# Patient Record
Sex: Female | Born: 1980 | Race: White | Hispanic: No | Marital: Married | Smoking: Never smoker
Health system: Southern US, Community
[De-identification: ages and names within clinical notes are randomized; demographics above are authoritative.]

## PROBLEM LIST (undated history)

## (undated) DIAGNOSIS — K5090 Crohn's disease, unspecified, without complications: Secondary | ICD-10-CM

---

## 2001-02-13 ENCOUNTER — Other Ambulatory Visit: Admission: RE | Admit: 2001-02-13 | Discharge: 2001-02-13 | Payer: Self-pay | Admitting: Otolaryngology

## 2001-04-29 ENCOUNTER — Other Ambulatory Visit: Admission: RE | Admit: 2001-04-29 | Discharge: 2001-04-29 | Payer: Self-pay | Admitting: Obstetrics and Gynecology

## 2001-06-25 ENCOUNTER — Other Ambulatory Visit: Admission: RE | Admit: 2001-06-25 | Discharge: 2001-06-25 | Payer: Self-pay | Admitting: Obstetrics and Gynecology

## 2001-10-21 ENCOUNTER — Other Ambulatory Visit: Admission: RE | Admit: 2001-10-21 | Discharge: 2001-10-21 | Payer: Self-pay | Admitting: Obstetrics and Gynecology

## 2002-02-10 ENCOUNTER — Encounter: Payer: Self-pay | Admitting: Obstetrics and Gynecology

## 2002-02-10 ENCOUNTER — Encounter: Admission: RE | Admit: 2002-02-10 | Discharge: 2002-02-10 | Payer: Self-pay | Admitting: Obstetrics and Gynecology

## 2002-05-04 ENCOUNTER — Other Ambulatory Visit: Admission: RE | Admit: 2002-05-04 | Discharge: 2002-05-04 | Payer: Self-pay | Admitting: Obstetrics and Gynecology

## 2002-08-24 ENCOUNTER — Other Ambulatory Visit: Admission: RE | Admit: 2002-08-24 | Discharge: 2002-08-24 | Payer: Self-pay | Admitting: Obstetrics and Gynecology

## 2002-11-05 ENCOUNTER — Encounter: Admission: RE | Admit: 2002-11-05 | Discharge: 2002-11-05 | Payer: Self-pay | Admitting: Internal Medicine

## 2002-11-05 ENCOUNTER — Encounter: Payer: Self-pay | Admitting: Internal Medicine

## 2002-12-17 ENCOUNTER — Encounter: Admission: RE | Admit: 2002-12-17 | Discharge: 2002-12-17 | Payer: Self-pay | Admitting: Internal Medicine

## 2002-12-17 ENCOUNTER — Encounter: Payer: Self-pay | Admitting: Internal Medicine

## 2002-12-30 ENCOUNTER — Other Ambulatory Visit: Admission: RE | Admit: 2002-12-30 | Discharge: 2002-12-30 | Payer: Self-pay | Admitting: Obstetrics and Gynecology

## 2003-05-06 ENCOUNTER — Other Ambulatory Visit: Admission: RE | Admit: 2003-05-06 | Discharge: 2003-05-06 | Payer: Self-pay | Admitting: Obstetrics and Gynecology

## 2004-04-10 ENCOUNTER — Encounter (INDEPENDENT_AMBULATORY_CARE_PROVIDER_SITE_OTHER): Payer: Self-pay | Admitting: Specialist

## 2004-04-10 ENCOUNTER — Ambulatory Visit (HOSPITAL_COMMUNITY): Admission: RE | Admit: 2004-04-10 | Discharge: 2004-04-10 | Payer: Self-pay | Admitting: Internal Medicine

## 2004-04-21 ENCOUNTER — Encounter: Admission: RE | Admit: 2004-04-21 | Discharge: 2004-04-21 | Payer: Self-pay | Admitting: Internal Medicine

## 2004-05-16 ENCOUNTER — Encounter (INDEPENDENT_AMBULATORY_CARE_PROVIDER_SITE_OTHER): Payer: Self-pay | Admitting: Specialist

## 2004-05-16 ENCOUNTER — Inpatient Hospital Stay (HOSPITAL_COMMUNITY): Admission: RE | Admit: 2004-05-16 | Discharge: 2004-05-23 | Payer: Self-pay | Admitting: General Surgery

## 2004-11-24 ENCOUNTER — Ambulatory Visit: Payer: Self-pay | Admitting: Internal Medicine

## 2004-11-27 ENCOUNTER — Ambulatory Visit: Payer: Self-pay | Admitting: Internal Medicine

## 2004-12-13 ENCOUNTER — Ambulatory Visit: Payer: Self-pay | Admitting: Internal Medicine

## 2005-02-08 ENCOUNTER — Ambulatory Visit: Payer: Self-pay | Admitting: Internal Medicine

## 2005-02-26 ENCOUNTER — Ambulatory Visit: Payer: Self-pay | Admitting: Internal Medicine

## 2005-02-26 ENCOUNTER — Ambulatory Visit (HOSPITAL_COMMUNITY): Admission: RE | Admit: 2005-02-26 | Discharge: 2005-02-26 | Payer: Self-pay | Admitting: Internal Medicine

## 2005-07-16 ENCOUNTER — Emergency Department (HOSPITAL_COMMUNITY): Admission: EM | Admit: 2005-07-16 | Discharge: 2005-07-16 | Payer: Self-pay | Admitting: Emergency Medicine

## 2007-08-19 ENCOUNTER — Emergency Department (HOSPITAL_COMMUNITY): Admission: EM | Admit: 2007-08-19 | Discharge: 2007-08-19 | Payer: Self-pay | Admitting: Emergency Medicine

## 2008-09-11 ENCOUNTER — Emergency Department (HOSPITAL_COMMUNITY): Admission: EM | Admit: 2008-09-11 | Discharge: 2008-09-11 | Payer: Self-pay | Admitting: Emergency Medicine

## 2011-04-06 NOTE — Discharge Summary (Signed)
NAME:  Betty Mendoza, Betty Mendoza                         ACCOUNT NO.:  [id number]   MEDICAL RECORD NO.:  [id number]                   PATIENT TYPE:  INP   LOCATION:  0371                                 FACILITY:  [facility]   PHYSICIAN:  Timothy E. Earlene Plater, M.D.              DATE OF BIRTH:  1981/06/22   DATE OF ADMISSION:  05/16/2004  DATE OF DISCHARGE:  05/23/2004                                 DISCHARGE SUMMARY   FINAL DIAGNOSIS:  Colonic inertia.   OPERATIVE PROCEDURE:  On May 16, 2004 total abdominal colectomy.   Please see the enclosed notes.  The patient had been seen, evaluated, and is  ready now to proceed with a total abdominal colectomy and she has been  carefully informed and fully consulted.  She was prepared at home over a 5-  day period in order to clear the colon and get her ready for this surgery.  Upon admission her laboratory data were normal including CBC, chemistry  profile, protime, pregnancy test.  Urinalysis was normal.   The patient was seen, evaluated, taken directly to the operating room where  the above-named procedure was accomplished.  She actually had a smooth,  benign, and uncomplicated postoperative recovery, a little slow in the mid  portion with some ileus, nausea, and vomiting that quickly resolved, and she  is ready for discharge on July 5.  She is on a regular soft small-feeding  diet and doing well.  Bowels are moving satisfactorily.  She is having some  crampy gas pains but no distention or nausea.   She and her mother have been carefully advised about activity, food intake,  expectations, and complications for which she should call.  Vicodin #48 is  given for pain and she will be seen and followed in the office.  Final  pathology report is not available.  The follow-up laboratory and x-rays were  all within normal limits.   The patient will be seen and followed as an outpatient.                                               Timothy E. Earlene Plater, M.D.    TED/MEDQ  D:  05/23/2004  T:  05/23/2004  Job:  [id number]   cc:   Lina Sar, M.D. [facility]   Tasia Catchings, M.D.  [address]  [place]  [place] [address]  Fax: [phone]

## 2011-04-06 NOTE — Op Note (Signed)
NAME:  Betty Mendoza, Betty Mendoza                         ACCOUNT NO.:  [id number]   MEDICAL RECORD NO.:  [id number]                   PATIENT TYPE:  INP   LOCATION:  0005                                 FACILITY:  [facility]   PHYSICIAN:  Timothy E. Earlene Plater, M.D.              DATE OF BIRTH:  07-Apr-1981   DATE OF PROCEDURE:  05/16/2004  DATE OF DISCHARGE:                                 OPERATIVE REPORT   PREOPERATIVE. DIAGNOSES:  Colonic inertia.   POSTOPERATIVE DIAGNOSES:  Colonic inertia.   PROCEDURE:  Total abdominal colectomy.   SURGEON:  Timothy E. Earlene Plater, M.D.   ASSISTANT:  Lebron Conners, M.D.   ANESTHESIA:  CRNA supervised M.D.   Please see the enclosed notes. This young woman has colonic inertia.  After  careful consideration and months of deliberation, consultation by Tasia Catchings, M.D. and Lina Sar, M.D., a complete workup, she wishes to  proceed with total abdominal colectomy. This has been carefully discussed  with her in the office including the expectations and the very real  potential for complications both short and long-term. She agrees and  understands. She was prepped at home over a five day period, brought in this  morning, IV started, hydration begun. She was seen and evaluated by  anesthesia. She was identified and a permit signed.   The patient was taken to the operating room, placed supine, general  endotracheal anesthesia administered.  PAS hose, Foley catheter, nasogastric  tube were placed. The abdomen was prepped and draped in the usual fashion.  The midline was marked and then a mid abdominal midline incision was made,  the peritoneum was entered without complication.  The colon was as expected  elongated and redundant. The cecum and the sigmoid were especially  redundant.  We estimated the points of transection being the junction of the  sigmoid and rectum and at the junction of the terminal ileum and cecum. The  lateral peritoneal bands were divided, the  retroperitoneal structures were  carefully identified including the ovarian veins, the ureters and the great  vessels and these were of course kept posterior. Most of the dissection was  done close to the edge of the colon rather than at the edge of the posterior  peritoneum. The splenic flexure was released without difficulty or  complications. The right colon was likewise dissected from the lateral bands  and the transverse colon was then carefully dissected dividing the omentum  between the colon and the stomach leaving as much as possible. We then  sequentially divided the mesentery of the entire colon between clamps which  were carefully tied with 2-0 silk ties. This freed the entire colon.  The  site at the terminal ileum was chosen, appropriate clamps were applied, the  ileum was divided and then the junction of the rectum and sigmoid was  carefully dissected, this mesentery divided and the colon was divided. The  entire specimen  was passed off the field. All areas were checked for  hemostasis and all were good. The upper abdomen was packed. The entire small  bowel was run, was properly aligned and a end to end anastomosis was created  using 3-0 silk ties, hand sewn end to end.  This was complete. It was  palpated, small bowel contents were run across this and there was no  evidence of air or water leak. Then copious irrigation was carried out.  Instruments and gloves changed. The mesentery was closed where possible with  3-0 Vicryl suture and the small bowel lay nicely across the abdomen.  Irrigation was clear, counts were correct, the anastomosis was secured. Also  to note that during the exploration and surgery, no other abnormalities of  the abdominal cavity were noted.   The patient was stable and closure was accomplished with #1 PDS suture.  Also the subcu was copiously irrigated, skin was closed with wide skin  staples. Final count was correct. The patient was stable,  estimated blood  loss 250 mL, none replaced.  She tolerated it well and was awakened and  taken to the recovery room in good condition.                                               Timothy E. Earlene Plater, M.D.    TED/MEDQ  D:  05/16/2004  T:  05/16/2004  Job:  [id number]   cc:   Lina Sar, M.D. [facility]   Tasia Catchings, M.D.  [address]  [place]  [place] [address]  Fax: [phone]

## 2011-08-20 LAB — CBC
HCT: 29.1 — ABNORMAL LOW
Hemoglobin: 9 — ABNORMAL LOW
MCV: 69.4 — ABNORMAL LOW
Platelets: 275
RBC: 4.2
WBC: 6.6

## 2011-08-20 LAB — POCT I-STAT, CHEM 8
BUN: 7
Calcium, Ion: 1.23
Chloride: 106
Creatinine, Ser: 0.7
HCT: 31 — ABNORMAL LOW
Hemoglobin: 10.5 — ABNORMAL LOW
Potassium: 4.1
TCO2: 23

## 2011-08-20 LAB — DIFFERENTIAL
Basophils Absolute: 0
Eosinophils Relative: 1
Monocytes Relative: 10
Neutrophils Relative %: 46

## 2011-08-20 LAB — POCT CARDIAC MARKERS: CKMB, poc: <1 — ABNORMAL LOW

## 2011-08-30 LAB — URINALYSIS, ROUTINE W REFLEX MICROSCOPIC
Hgb urine dipstick: NEGATIVE
Ketones, ur: 15 — AB
Protein, ur: 30 — AB
Urobilinogen, UA: 0.2

## 2011-08-30 LAB — POCT PREGNANCY, URINE
Operator id: 27011
Preg Test, Ur: NEGATIVE

## 2011-08-30 LAB — I-STAT 8, (EC8 V) (CONVERTED LAB)
Acid-base deficit: 7 — ABNORMAL HIGH
BUN: 15
Chloride: 108
Glucose, Bld: 112 — ABNORMAL HIGH
Hemoglobin: 18 — ABNORMAL HIGH
Operator id: 270111
TCO2: 18
pCO2, Ven: 30 — ABNORMAL LOW
pH, Ven: 7.359 — ABNORMAL HIGH

## 2011-08-30 LAB — CBC
Platelets: 315
RBC: 6.26 — ABNORMAL HIGH
RDW: 18.4 — ABNORMAL HIGH

## 2011-08-30 LAB — DIFFERENTIAL
Basophils Relative: 0
Eosinophils Relative: 1
Lymphocytes Relative: 25
Monocytes Relative: 11
Neutro Abs: 5

## 2011-08-30 LAB — URINE MICROSCOPIC-ADD ON

## 2013-07-26 ENCOUNTER — Emergency Department: Payer: Self-pay | Admitting: Internal Medicine

## 2013-09-04 ENCOUNTER — Emergency Department: Payer: Self-pay | Admitting: Emergency Medicine

## 2013-09-04 LAB — CBC
HCT: 45.1 % (ref 35.0–47.0)
HGB: 14.8 g/dL (ref 12.0–16.0)
MCV: 80 fL (ref 80–100)
RBC: 5.62 10*6/uL — ABNORMAL HIGH (ref 3.80–5.20)
RDW: 15.4 % — ABNORMAL HIGH (ref 11.5–14.5)

## 2013-09-04 LAB — COMPREHENSIVE METABOLIC PANEL
Alkaline Phosphatase: 84 U/L (ref 50–136)
Anion Gap: 8 (ref 7–16)
BUN: 8 mg/dL (ref 7–18)
Bilirubin,Total: 1.7 mg/dL — ABNORMAL HIGH (ref 0.2–1.0)
Chloride: 104 mmol/L (ref 98–107)
Glucose: 111 mg/dL — ABNORMAL HIGH (ref 65–99)
Potassium: 3.7 mmol/L (ref 3.5–5.1)
SGPT (ALT): 16 U/L (ref 12–78)
Sodium: 137 mmol/L (ref 136–145)

## 2013-09-04 LAB — LIPASE, BLOOD: Lipase: 131 U/L (ref 73–393)

## 2013-09-05 LAB — URINALYSIS, COMPLETE
Glucose,UR: NEGATIVE mg/dL (ref 0–75)
Ketone: NEGATIVE
Leukocyte Esterase: NEGATIVE
Nitrite: NEGATIVE
Specific Gravity: 1.025 (ref 1.003–1.030)
Squamous Epithelial: 2

## 2013-09-05 LAB — WET PREP, GENITAL

## 2013-09-05 LAB — GC/CHLAMYDIA PROBE AMP

## 2016-10-31 DIAGNOSIS — R002 Palpitations: Secondary | ICD-10-CM | POA: Insufficient documentation

## 2016-11-07 ENCOUNTER — Ambulatory Visit (INDEPENDENT_AMBULATORY_CARE_PROVIDER_SITE_OTHER): Payer: 59

## 2016-11-07 DIAGNOSIS — R002 Palpitations: Secondary | ICD-10-CM

## 2017-04-30 ENCOUNTER — Emergency Department
Admission: EM | Admit: 2017-04-30 | Discharge: 2017-04-30 | Disposition: A | Discharge status: Home / Self Care | Payer: 59 | Attending: Emergency Medicine | Admitting: Emergency Medicine

## 2017-04-30 ENCOUNTER — Encounter: Payer: Self-pay | Admitting: Emergency Medicine

## 2017-04-30 DIAGNOSIS — E860 Dehydration: Secondary | ICD-10-CM | POA: Diagnosis not present

## 2017-04-30 DIAGNOSIS — Z791 Long term (current) use of non-steroidal anti-inflammatories (NSAID): Secondary | ICD-10-CM | POA: Insufficient documentation

## 2017-04-30 DIAGNOSIS — K529 Noninfective gastroenteritis and colitis, unspecified: Secondary | ICD-10-CM | POA: Diagnosis not present

## 2017-04-30 DIAGNOSIS — Z79899 Other long term (current) drug therapy: Secondary | ICD-10-CM | POA: Insufficient documentation

## 2017-04-30 DIAGNOSIS — R112 Nausea with vomiting, unspecified: Secondary | ICD-10-CM | POA: Diagnosis present

## 2017-04-30 HISTORY — DX: Crohn's disease, unspecified, without complications: K50.90

## 2017-04-30 LAB — CBC
HEMATOCRIT: 46.7 % (ref 35.0–47.0)
HEMOGLOBIN: 15.4 g/dL (ref 12.0–16.0)
MCH: 28.5 pg (ref 26.0–34.0)
MCHC: 33 g/dL (ref 32.0–36.0)
MCV: 86.4 fL (ref 80.0–100.0)
Platelets: 229 10*3/uL (ref 150–440)
RBC: 5.4 MIL/uL — AB (ref 3.80–5.20)
RDW: 15.1 % — ABNORMAL HIGH (ref 11.5–14.5)
WBC: 18 10*3/uL — ABNORMAL HIGH (ref 3.6–11.0)

## 2017-04-30 LAB — URINALYSIS, COMPLETE (UACMP) WITH MICROSCOPIC
BILIRUBIN URINE: NEGATIVE
Glucose, UA: NEGATIVE mg/dL
HGB URINE DIPSTICK: NEGATIVE
KETONES UR: NEGATIVE mg/dL
Leukocytes, UA: NEGATIVE
Nitrite: NEGATIVE
PH: 5 (ref 5.0–8.0)
Protein, ur: 30 mg/dL — AB
SPECIFIC GRAVITY, URINE: 1.024 (ref 1.005–1.030)

## 2017-04-30 LAB — COMPREHENSIVE METABOLIC PANEL
ALBUMIN: 5.7 g/dL — AB (ref 3.5–5.0)
ALT: 15 U/L (ref 14–54)
ANION GAP: 12 (ref 5–15)
AST: 18 U/L (ref 15–41)
Alkaline Phosphatase: 57 U/L (ref 38–126)
BUN: 18 mg/dL (ref 6–20)
CHLORIDE: 105 mmol/L (ref 101–111)
CO2: 20 mmol/L — AB (ref 22–32)
Calcium: 10 mg/dL (ref 8.9–10.3)
Creatinine, Ser: 1.07 mg/dL — ABNORMAL HIGH (ref 0.44–1.00)
GFR calc non Af Amer: >60 mL/min (ref >60)
GLUCOSE: 167 mg/dL — AB (ref 65–99)
Potassium: 4.2 mmol/L (ref 3.5–5.1)
SODIUM: 137 mmol/L (ref 135–145)
Total Bilirubin: 3 mg/dL — ABNORMAL HIGH (ref 0.3–1.2)
Total Protein: 8.9 g/dL — ABNORMAL HIGH (ref 6.5–8.1)

## 2017-04-30 LAB — LIPASE, BLOOD: Lipase: 32 U/L (ref 11–51)

## 2017-04-30 MED ORDER — SODIUM CHLORIDE 0.9 % IV SOLN
1000.0000 mL | Freq: Once | INTRAVENOUS | Status: AC
  Administered 2017-04-30: 1000 mL via INTRAVENOUS

## 2017-04-30 MED ORDER — ONDANSETRON HCL 4 MG/2ML IJ SOLN
INTRAMUSCULAR | Status: AC
  Filled 2017-04-30: qty 2

## 2017-04-30 MED ORDER — ONDANSETRON HCL 4 MG/2ML IJ SOLN
4.0000 mg | Freq: Once | INTRAMUSCULAR | Status: AC
  Administered 2017-04-30: 4 mg via INTRAVENOUS

## 2017-04-30 MED ORDER — ONDANSETRON 4 MG PO TBDP: 4.0000 mg | ORAL_TABLET | Freq: Three times a day (TID) | ORAL | 0 refills | Status: AC | PRN

## 2017-04-30 NOTE — ED Notes (Signed)
Pt given ginger ale by request (ok'ed by Dr. Cyril LoosenKinner). Pt given warm blanket, call light placed on bed, emesis bag given to pt and pillow placed on bed. Pt informed urine is needed.

## 2017-04-30 NOTE — ED Notes (Signed)
Pt presents with n/v/d since 10pm. States she has no large intestine due to surgery for Crohn's. She states she has vomited 23x since last night and "double that" for diarrhea episodes. Pt c/o muscle cramps and headache as well. Pt alert & oriented with NAD noted.

## 2017-04-30 NOTE — ED Triage Notes (Addendum)
Pt arrived to triage in wheelchair. Pt reports she has had N/V/D since 2200 last night, unrelieved by Zofran. Pt sts she does not have a large intestine and gets dehydrated quickly when sick so pt to ED so dehydration does not happen. Pt has Hx of Crohns.

## 2017-04-30 NOTE — ED Provider Notes (Signed)
[facility] Emergency Department Provider Note   ____________________________________________    I have reviewed the triage vital signs and the nursing notes.   HISTORY  Chief Complaint Emesis and Diarrhea     HPI Betty Mendoza is a 36 y.o. female with a history of Crohn's disease who presents with complaints of nausea vomiting and diarrhea. Patient reports she started to feel ill last night. She feels that she has a stomach virus and has been vomiting numerous times through the night. Given her history of colon resection she states she becomes rapidly dehydrated and came to the ED for IV fluids. She received IV Zofran and feels that her nausea is better. Her physicians are at [facility]. She states she has had bowel obstructions in the past but this does not feel anything like that. She does not have any significant abdominal pain. No fevers reported   Past Medical History:  Diagnosis Date  . Crohn disease [facility](HCC)     Patient Active Problem List   Diagnosis Date Noted  . Palpitations 10/31/2016    History reviewed. No pertinent surgical history.  Prior to Admission medications   Medication Sig Start Date End Date Taking? Authorizing Provider  Adalimumab 40 MG/0.8ML PNKT Inject 40 mg into the skin every Friday.   Yes [provider]  cyanocobalamin (,VITAMIN B-12,) 1000 MCG/ML injection Inject 1 mL into the muscle every 30 (thirty) days.   Yes [provider]  ibuprofen (ADVIL,MOTRIN) 200 MG tablet Take 400-600 mg by mouth every 8 (eight) hours as needed for moderate pain.    Yes [provider]  iron dextran complex in sodium chloride 0.9 % 500 mL Inject 1,500 mg into the vein every 3 (three) months.   Yes [provider]  norethindrone (MICRONOR,CAMILA,ERRIN) 0.35 MG tablet Take 1 tablet by mouth daily. continuously   Yes [provider]  promethazine (PHENERGAN) 25 MG tablet Take 25 mg by mouth every  6 (six) hours as needed for nausea or vomiting.    Yes [provider]  valACYclovir (VALTREX) 500 MG tablet Take 500 mg by mouth daily as needed. For flare-ups   Yes [provider]  ondansetron (ZOFRAN ODT) 4 MG disintegrating tablet Take 1 tablet (4 mg total) by mouth every 8 (eight) hours as needed for nausea or vomiting. 04/30/17   Jene EveryKinner, Lorena Benham, MD     Allergies Vioxx [rofecoxib]; Erythromycin; Remicade [infliximab]; and Venofer [iron sucrose]  History reviewed. No pertinent family history.  Social History Social History  Substance Use Topics  . Smoking status: Never Smoker  . Smokeless tobacco: Never Used  . Alcohol use No    Review of Systems  Constitutional: No fever/chills Eyes: No visual changes.  ENT: No sore throat. Cardiovascular: Denies chest pain. Respiratory: Denies shortness of breath. Gastrointestinal: No abdominal pain.  As above Genitourinary: Negative for dysuria. Musculoskeletal: Negative for back pain. Skin: Negative for rash. Neurological: Mild headache   ____________________________________________   PHYSICAL EXAM:  VITAL SIGNS: ED Triage Vitals  Enc Vitals Group     BP 04/30/17 0600 107/66     Pulse Rate 04/30/17 0600 73     Resp 04/30/17 0600 16     Temp 04/30/17 0600 98.1 F (36.7 C)     Temp Source 04/30/17 0600 Oral     SpO2 04/30/17 0600 98 %     Weight 04/30/17 0601 68.9 kg (152 lb)     Height 04/30/17 0601 1.854 m (6\' 1" )  Head Circumference --      Peak Flow --      Pain Score --      Pain Loc --      Pain Edu? --      Excl. in GC? --     Constitutional: Alert and oriented. No acute distress. Pleasant and interactive  Nose: No congestion/rhinnorhea. Mouth/Throat: Mucous membranes are Dry  Cardiovascular: Normal rate, regular rhythm. Grossly normal heart sounds.  Good peripheral circulation. Respiratory: Normal respiratory effort.  No retractions.  Gastrointestinal: Soft and nontender. No  distention.  No CVA tenderness. Genitourinary: deferred Musculoskeletal: No lower extremity tenderness nor edema.  Warm and well perfused Neurologic:  Normal speech and language. No gross focal neurologic deficits are appreciated.  Skin:  Skin is warm, dry and intact. No rash noted. Psychiatric: Mood and affect are normal. Speech and behavior are normal.  ____________________________________________   LABS (all labs ordered are listed, but only abnormal results are displayed)  Labs Reviewed  COMPREHENSIVE METABOLIC PANEL - Abnormal; Notable for the following:       Result Value   CO2 20 (*)    Glucose, Bld 167 (*)    Creatinine, Ser 1.07 (*)    Total Protein 8.9 (*)    Albumin 5.7 (*)    Total Bilirubin 3.0 (*)    All other components within normal limits  CBC - Abnormal; Notable for the following:    WBC 18.0 (*)    RBC 5.40 (*)    RDW 15.1 (*)    All other components within normal limits  LIPASE, BLOOD  URINALYSIS, COMPLETE (UACMP) WITH MICROSCOPIC  POC URINE PREG, ED   ____________________________________________  EKG  None ____________________________________________  RADIOLOGY  None ____________________________________________   PROCEDURES  Procedure(s) performed: No    Critical Care performed: No ____________________________________________   INITIAL IMPRESSION / ASSESSMENT AND PLAN / ED COURSE  Pertinent labs & imaging results that were available during my care of the patient were reviewed by me and considered in my medical decision making (see chart for details).  Patient well-appearing and in no acute distress. She feels that she likely has a stomach virus because she was recently at 2 children's birthday parties and this does not feel like an obstruction to her. Her abdominal exam is reassuring. She does have an elevated white blood cell count but this could certainly be related to a GI virus/vomiting.  We will give IV fluids and  reevaluate  ----------------------------------------- 10:10 AM on 04/30/2017 -----------------------------------------  Patient reports she feels much better after fluids. She is well-appearing with normal vital signs. She is tolerating by mouth's. She has no abdominal pain. She feels ready for discharge, she knows to return if any change in her symptoms. She is quite comfortable with this plan      ____________________________________________   FINAL CLINICAL IMPRESSION(S) / ED DIAGNOSES  Final diagnoses:  Dehydration  Gastroenteritis      NEW MEDICATIONS STARTED DURING THIS VISIT:  New Prescriptions   ONDANSETRON (ZOFRAN ODT) 4 MG DISINTEGRATING TABLET    Take 1 tablet (4 mg total) by mouth every 8 (eight) hours as needed for nausea or vomiting.     Note:  This document was prepared using Dragon voice recognition software and may include unintentional dictation errors.    Jene Every, MD 04/30/17 1010

## 2019-01-19 ENCOUNTER — Other Ambulatory Visit: Payer: Self-pay | Admitting: Geriatric Medicine

## 2019-01-19 ENCOUNTER — Ambulatory Visit
Admission: RE | Admit: 2019-01-19 | Discharge: 2019-01-19 | Disposition: A | Discharge status: Home / Self Care | Payer: 59 | Source: Ambulatory Visit | Attending: Geriatric Medicine | Admitting: Geriatric Medicine

## 2019-01-19 DIAGNOSIS — R059 Cough, unspecified: Secondary | ICD-10-CM

## 2019-01-19 DIAGNOSIS — R05 Cough: Secondary | ICD-10-CM

## 2019-06-01 ENCOUNTER — Encounter (HOSPITAL_COMMUNITY): Payer: Self-pay

## 2019-06-01 ENCOUNTER — Ambulatory Visit (HOSPITAL_COMMUNITY)
Admission: EM | Admit: 2019-06-01 | Discharge: 2019-06-01 | Disposition: A | Discharge status: Home / Self Care | Payer: 59 | Attending: Internal Medicine | Admitting: Internal Medicine

## 2019-06-01 ENCOUNTER — Other Ambulatory Visit: Payer: Self-pay

## 2019-06-01 DIAGNOSIS — Z1833 Retained wood fragments: Secondary | ICD-10-CM

## 2019-06-01 DIAGNOSIS — Z23 Encounter for immunization: Secondary | ICD-10-CM | POA: Diagnosis not present

## 2019-06-01 MED ORDER — TETANUS-DIPHTH-ACELL PERTUSSIS 5-2.5-18.5 LF-MCG/0.5 IM SUSP
0.5000 mL | Freq: Once | INTRAMUSCULAR | Status: AC
  Administered 2019-06-01: 0.5 mL via INTRAMUSCULAR

## 2019-06-01 MED ORDER — TETANUS-DIPHTH-ACELL PERTUSSIS 5-2.5-18.5 LF-MCG/0.5 IM SUSP
INTRAMUSCULAR | Status: AC
  Filled 2019-06-01: qty 0.5

## 2019-06-01 NOTE — ED Triage Notes (Signed)
Patient presents to Urgent Care with complaints of splinter in the bottom of her right foot since this morning while walking on the deck. Patient reports tetanus is unknown, thinks piece of wood is about an inch long.

## 2019-06-01 NOTE — ED Provider Notes (Signed)
[facility]    CSN: [id number] Arrival date & time: 06/01/19  1527      History   Chief Complaint Chief Complaint  Patient presents with  . Foreign Body in Skin    HPI Betty Mendoza is a 38 y.o. female with a history of Crohn's disease on Humira, vitamin B12 deficiency on injection vitamin B comes to urgent care after she tripped on the deck resulting in an embedded wooden splinter.  Wooden splinter was embedded in the right foot.  Patient tried to take it out but was successful in taking only part of the wood splinter out.  Last tetanus vaccination is more than 8 years ago.   HPI  Past Medical History:  Diagnosis Date  . Crohn disease [facility](HCC)     Patient Active Problem List   Diagnosis Date Noted  . Palpitations 10/31/2016    History reviewed. No pertinent surgical history.  OB History   No obstetric history on file.      Home Medications    Prior to Admission medications   Medication Sig Start Date End Date Taking? Authorizing Provider  Adalimumab 40 MG/0.8ML PNKT Inject 40 mg into the skin every Friday.    [provider]  cyanocobalamin (,VITAMIN B-12,) 1000 MCG/ML injection Inject 1 mL into the muscle every 30 (thirty) days.    [provider]  ibuprofen (ADVIL,MOTRIN) 200 MG tablet Take 400-600 mg by mouth every 8 (eight) hours as needed for moderate pain.     [provider]  iron dextran complex in sodium chloride 0.9 % 500 mL Inject 1,500 mg into the vein every 3 (three) months.    [provider]  norethindrone (MICRONOR,CAMILA,ERRIN) 0.35 MG tablet Take 1 tablet by mouth daily. continuously    [provider]  ondansetron (ZOFRAN ODT) 4 MG disintegrating tablet Take 1 tablet (4 mg total) by mouth every 8 (eight) hours as needed for nausea or vomiting. 04/30/17   Jene EveryKinner, Robert, MD  promethazine (PHENERGAN) 25 MG tablet Take 25 mg by mouth every 6 (six) hours as needed for nausea or vomiting.      [provider]  valACYclovir (VALTREX) 500 MG tablet Take 500 mg by mouth daily as needed. For flare-ups    [provider]    Family History Family History  Problem Relation Age of Onset  . Healthy Mother   . Healthy Father     Social History Social History   Tobacco Use  . Smoking status: Never Smoker  . Smokeless tobacco: Never Used  Substance Use Topics  . Alcohol use: No  . Drug use: No     Allergies   Vioxx [rofecoxib], Erythromycin, Remicade [infliximab], and Venofer [iron sucrose]   Review of Systems Review of Systems  Constitutional: Negative for activity change, appetite change, fatigue and fever.  HENT: Negative.   Gastrointestinal: Negative for diarrhea and nausea.  Musculoskeletal: Positive for arthralgias and myalgias. Negative for gait problem.  Skin: Positive for wound. Negative for pallor and rash.  Hematological: Does not bruise/bleed easily.     Physical Exam Triage Vital Signs ED Triage Vitals  Enc Vitals Group     BP 06/01/19 1623 (!) 120/96     Pulse Rate 06/01/19 1623 79     Resp 06/01/19 1623 17     Temp 06/01/19 1623 98.4 F (36.9 C)     Temp Source 06/01/19 1623 Oral     SpO2 06/01/19 1623 100 %  Weight --      Height --      Head Circumference --      Peak Flow --      Pain Score 06/01/19 1621 2     Pain Loc --      Pain Edu? --      Excl. in GC? --    No data found.  Updated Vital Signs BP (!) 120/96 (BP Location: Left Arm)   Pulse 79   Temp 98.4 F (36.9 C) (Oral)   Resp 17   SpO2 100%   Visual Acuity Right Eye Distance:   Left Eye Distance:   Bilateral Distance:    Right Eye Near:   Left Eye Near:    Bilateral Near:     Physical Exam Vitals signs and nursing note reviewed.  Constitutional:      General: She is in acute distress.     Appearance: Normal appearance.  Pulmonary:     Effort: Pulmonary effort is normal.  Abdominal:     General: Abdomen is flat.  Musculoskeletal:         General: Signs of injury present. No swelling.     Comments: Embedded wood splinter at the base of the right great toe  Skin:    General: Skin is warm.     Findings: No bruising or rash.  Neurological:     Mental Status: She is alert.      UC Treatments / Results  Labs (all labs ordered are listed, but only abnormal results are displayed) Labs Reviewed - No data to display  EKG   Radiology No results found.  Procedures Foreign Body Removal  Date/Time: 06/02/2019 4:35 PM Performed by: Merrilee JanskyLamptey, Philip O, MD Authorized by: Merrilee JanskyLamptey, Philip O, MD   Consent:    Consent obtained:  Verbal   Consent given by:  Patient   Risks discussed:  Incomplete removal, infection and bleeding   Alternatives discussed:  No treatment Location:    Location:  Foot   Foot location:  R sole   Depth:  Subcutaneous   Tendon involvement:  None Pre-procedure details:    Imaging:  None   Neurovascular status: intact     Preparation: Patient was prepped and draped in usual sterile fashion   Anesthesia (see MAR for exact dosages):    Anesthesia method:  Local infiltration   Local anesthetic:  Lidocaine 1% w/o epi Procedure type:    Procedure complexity:  Simple Procedure details:    Localization method:  Visualized   Dissection of underlying tissues: no     Bloodless field: no     Removal mechanism:  Forceps   Foreign bodies recovered:  1   Description:  Wooden splinter   Intact foreign body removal: yes   Post-procedure details:    Neurovascular status: intact     Confirmation:  No additional foreign bodies on visualization   Skin closure:  None   Dressing:  Antibiotic ointment and adhesive bandage   Patient tolerance of procedure:  Tolerated well, no immediate complications   (including critical care time)  Medications Ordered in UC Medications  Tdap (BOOSTRIX) injection 0.5 mL (has no administration in time range)    Initial Impression / Assessment and Plan / UC Course  I have  reviewed the triage vital signs and the nursing notes.  Pertinent labs & imaging results that were available during my care of the patient were reviewed by me and considered in my medical decision making (see chart for  details).    1.  Embedded wood splinter: Foreign body removal Antibiotic dressing placed over the wound Tdap vaccination given Tylenol/Motrin pain management  2.  History of Crohn's disease status post bowel resection Continue Humira Continue multivitamins for multivitamin deficiencies. Final Clinical Impressions(s) / UC Diagnoses   Final diagnoses:  Embedded wood splinter   Discharge Instructions   None    ED Prescriptions    None     Controlled Substance Prescriptions [place] Controlled Substance Registry consulted? No   Chase Picket, MD 06/02/19 [phone]

## 2019-06-02 DIAGNOSIS — Z1833 Retained wood fragments: Secondary | ICD-10-CM | POA: Diagnosis not present

## 2019-10-29 ENCOUNTER — Ambulatory Visit
Admission: RE | Admit: 2019-10-29 | Discharge: 2019-10-29 | Disposition: A | Discharge status: Home / Self Care | Payer: 59 | Source: Ambulatory Visit | Attending: Geriatric Medicine | Admitting: Geriatric Medicine

## 2019-10-29 ENCOUNTER — Other Ambulatory Visit: Payer: Self-pay | Admitting: Geriatric Medicine

## 2019-10-29 DIAGNOSIS — M79604 Pain in right leg: Secondary | ICD-10-CM

## 2019-10-29 DIAGNOSIS — M7989 Other specified soft tissue disorders: Secondary | ICD-10-CM

## 2019-10-29 DIAGNOSIS — M79605 Pain in left leg: Secondary | ICD-10-CM

## 2019-11-23 ENCOUNTER — Other Ambulatory Visit: Payer: Self-pay | Admitting: Geriatric Medicine

## 2019-11-26 ENCOUNTER — Other Ambulatory Visit: Payer: Self-pay

## 2019-11-26 ENCOUNTER — Ambulatory Visit
Admission: RE | Admit: 2019-11-26 | Discharge: 2019-11-26 | Disposition: A | Discharge status: Home / Self Care | Payer: 59 | Source: Ambulatory Visit | Attending: Geriatric Medicine | Admitting: Geriatric Medicine

## 2019-11-26 DIAGNOSIS — M7989 Other specified soft tissue disorders: Secondary | ICD-10-CM

## 2019-11-26 MED ORDER — GADOBENATE DIMEGLUMINE 529 MG/ML IV SOLN
14.0000 mL | Freq: Once | INTRAVENOUS | Status: AC | PRN
Start: 2019-11-26 — End: 2019-11-26
  Administered 2019-11-26: 14 mL via INTRAVENOUS

## 2021-08-10 IMAGING — CR DG FEMUR 2+V*R*
4 series · 4 of 4 positions shown · non-contrast
Comparison: None.

CLINICAL DATA: Right leg pain

EXAM:
RIGHT FEMUR 2 VIEWS

[t femur with hip  ap right]
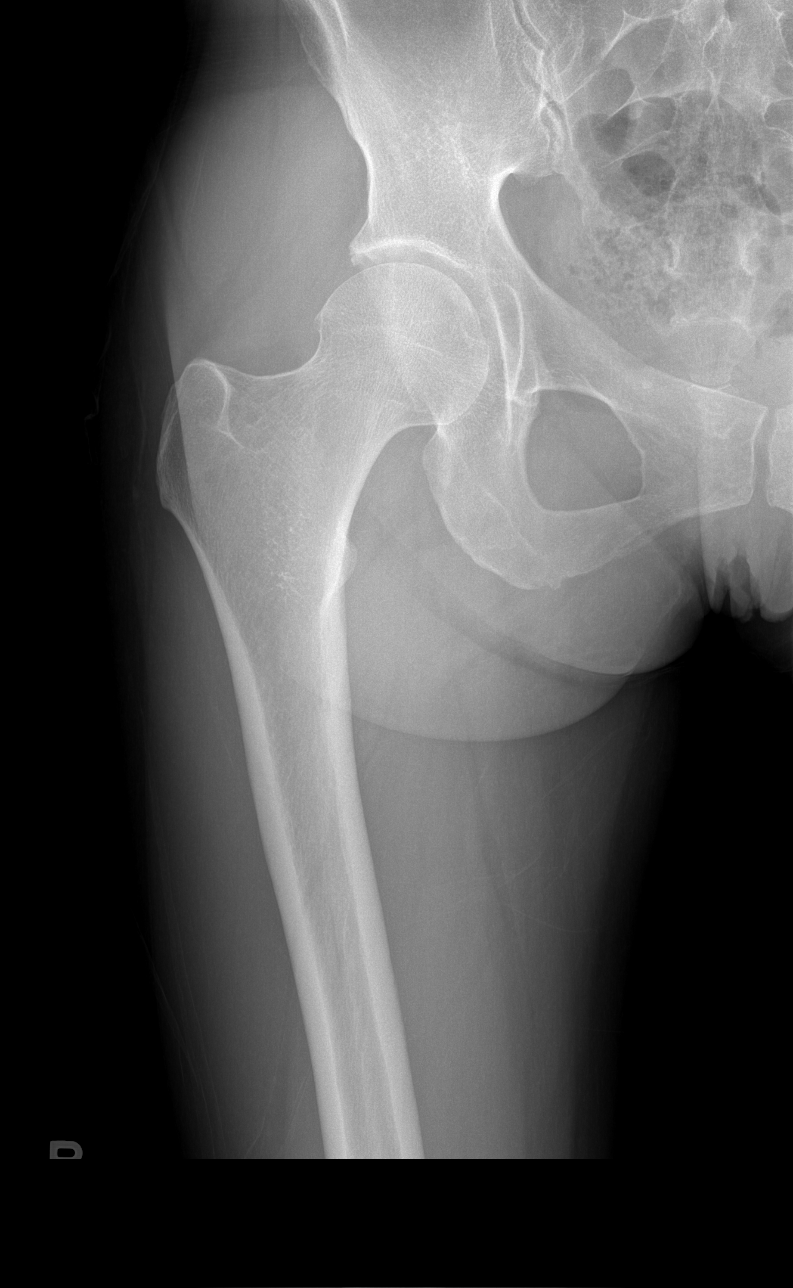

[t femur with knee ap right]
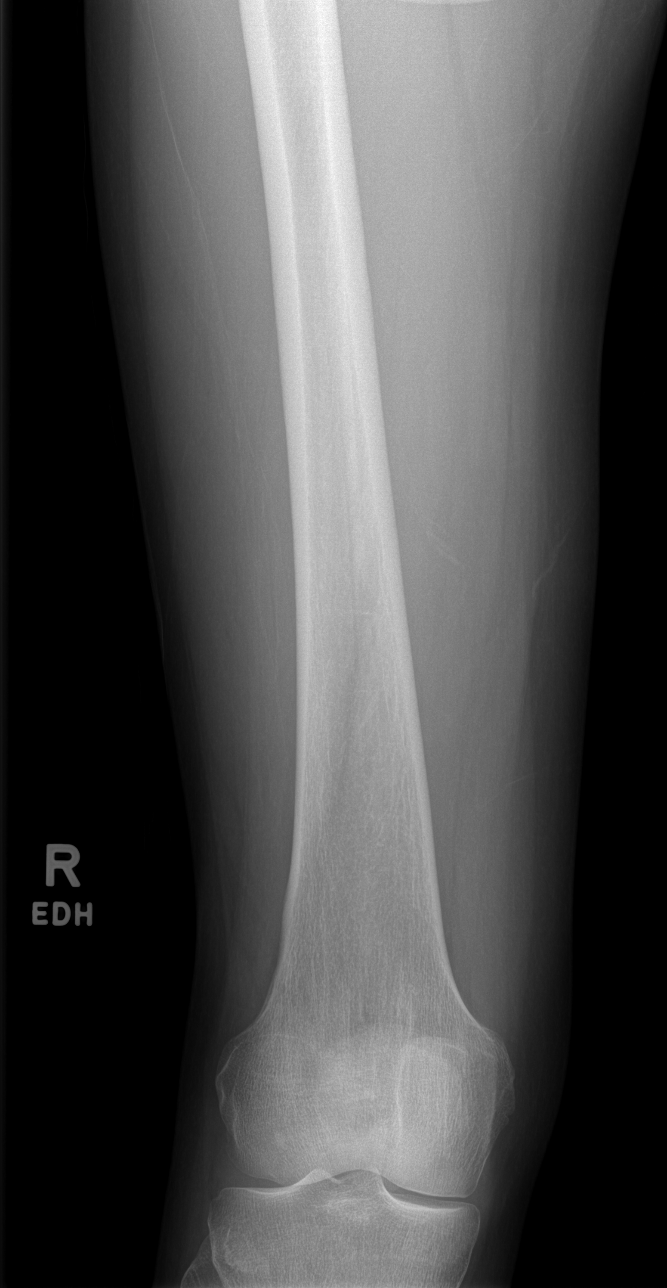

[t femur with hip lat right]
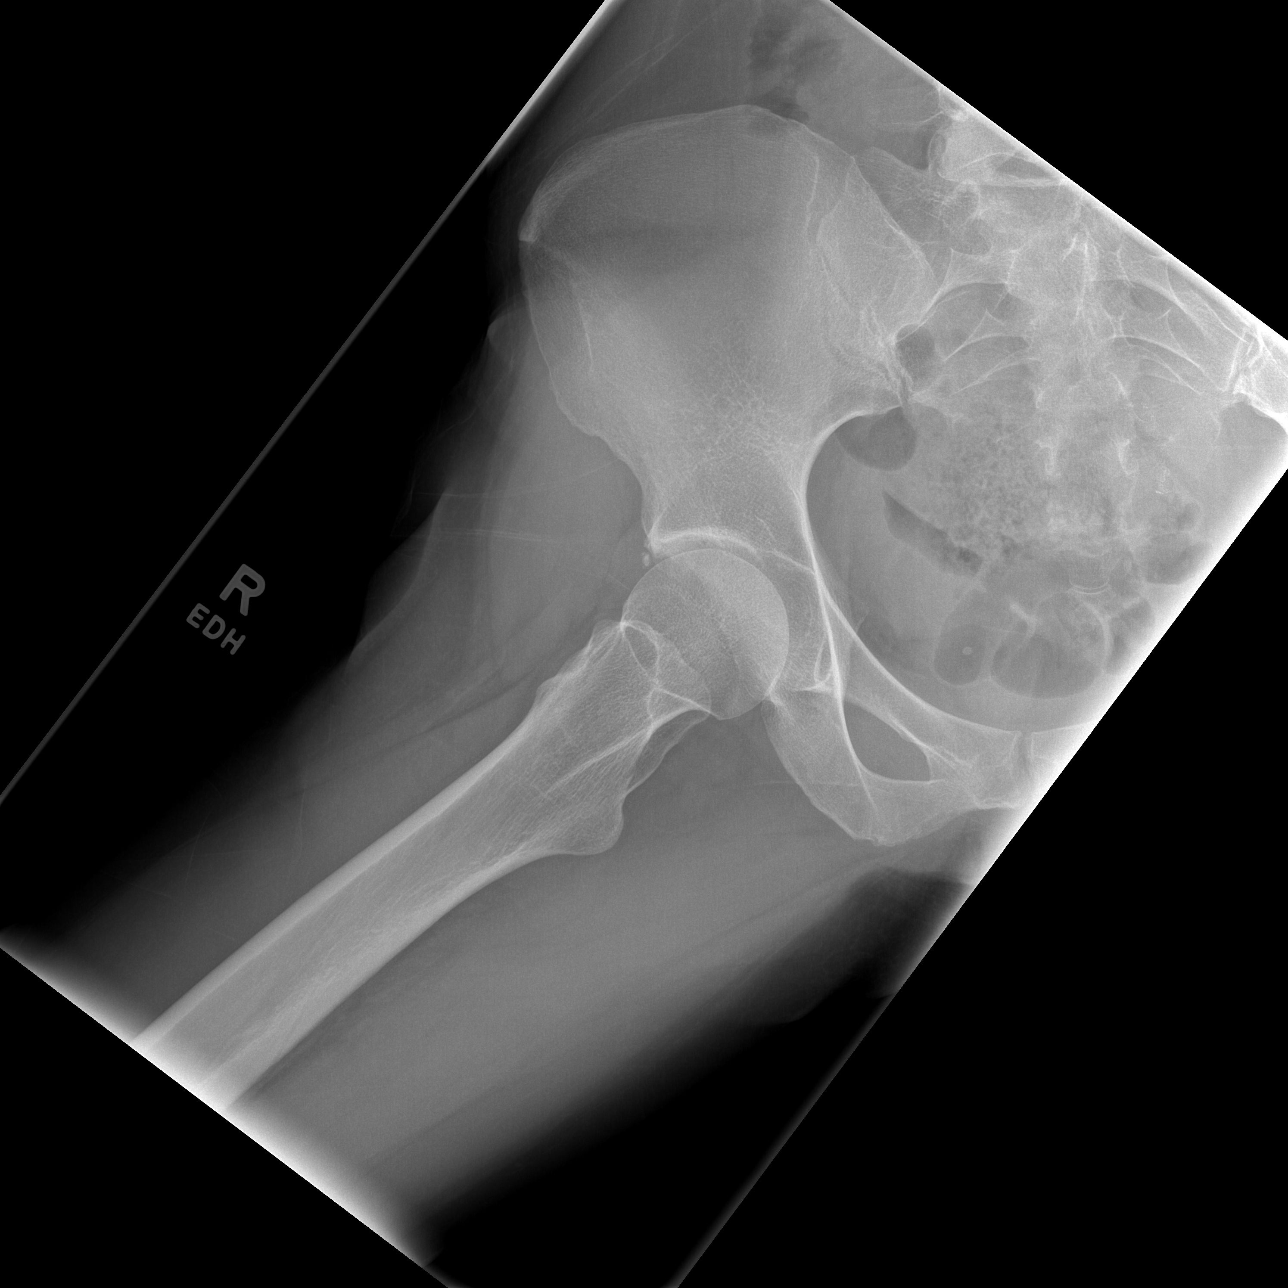

[t femur with knee lat right]
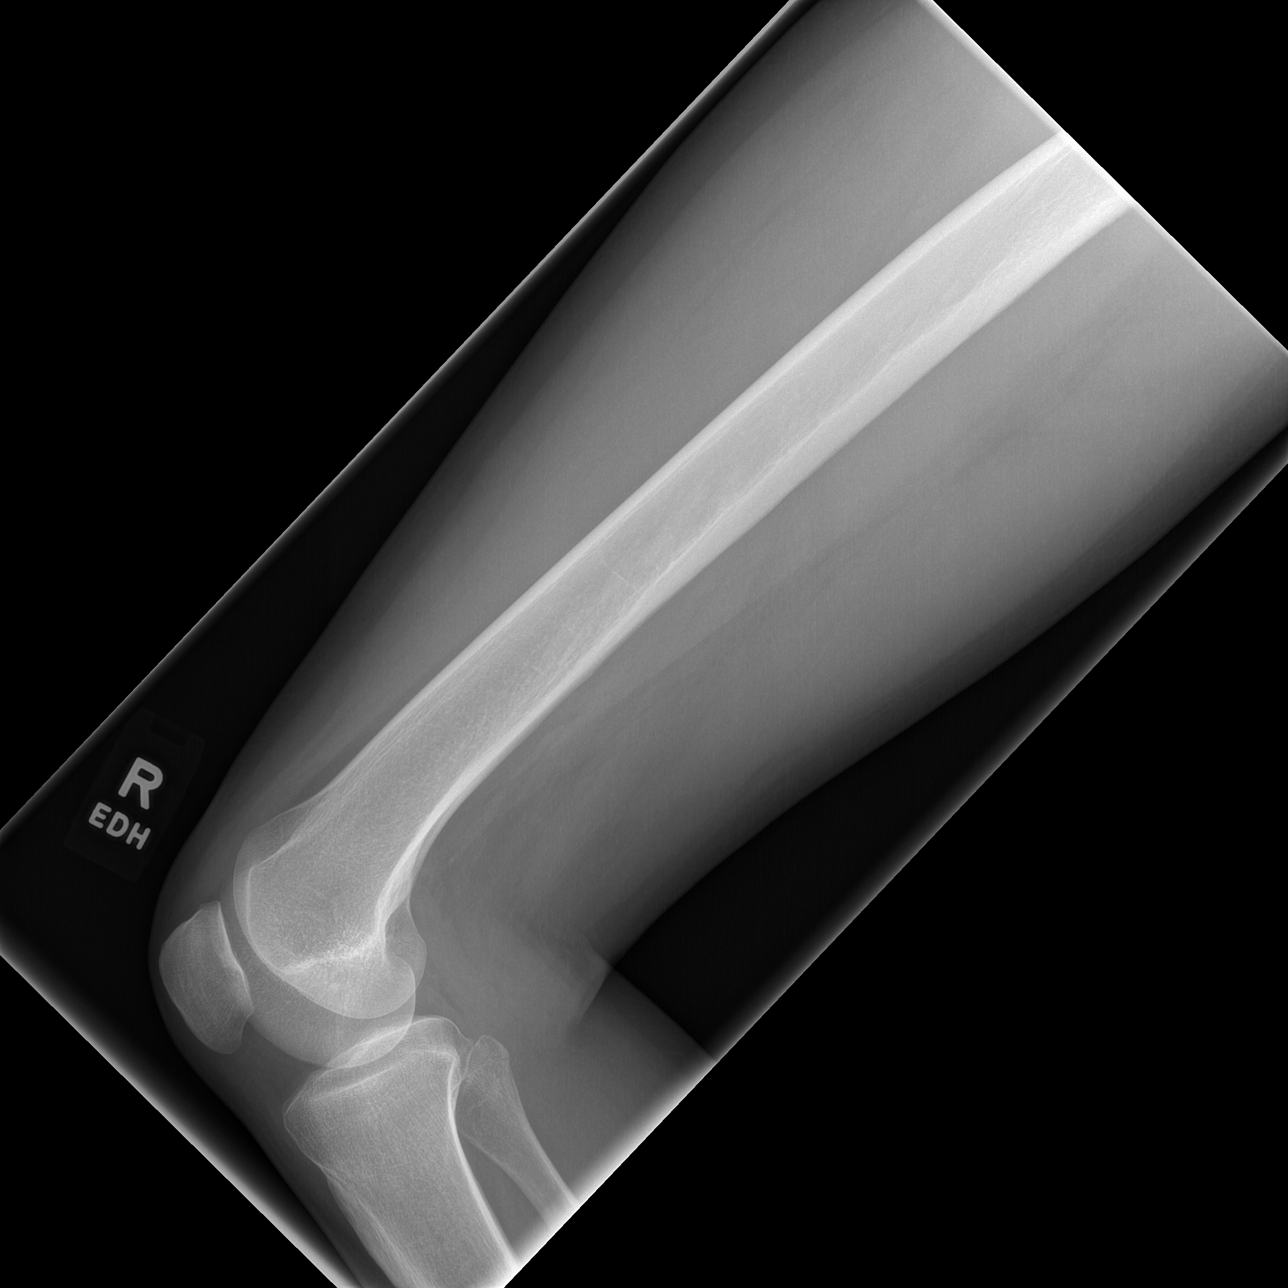

[4 of 4 positions shown; findings below may reference images not displayed]

FINDINGS: No fracture or dislocation of the right femur. Included joint spaces
are preserved. Soft tissues are unremarkable.
IMPRESSION: No fracture or dislocation of the right femur.

## 2021-08-10 IMAGING — CR DG TIBIA/FIBULA 2V*R*
4 series · 4 of 4 positions shown · non-contrast
Comparison: None.

CLINICAL DATA: Leg pain

EXAM:
RIGHT TIBIA AND FIBULA - 2 VIEW

[t tib/fib ap right (1 of 2)]
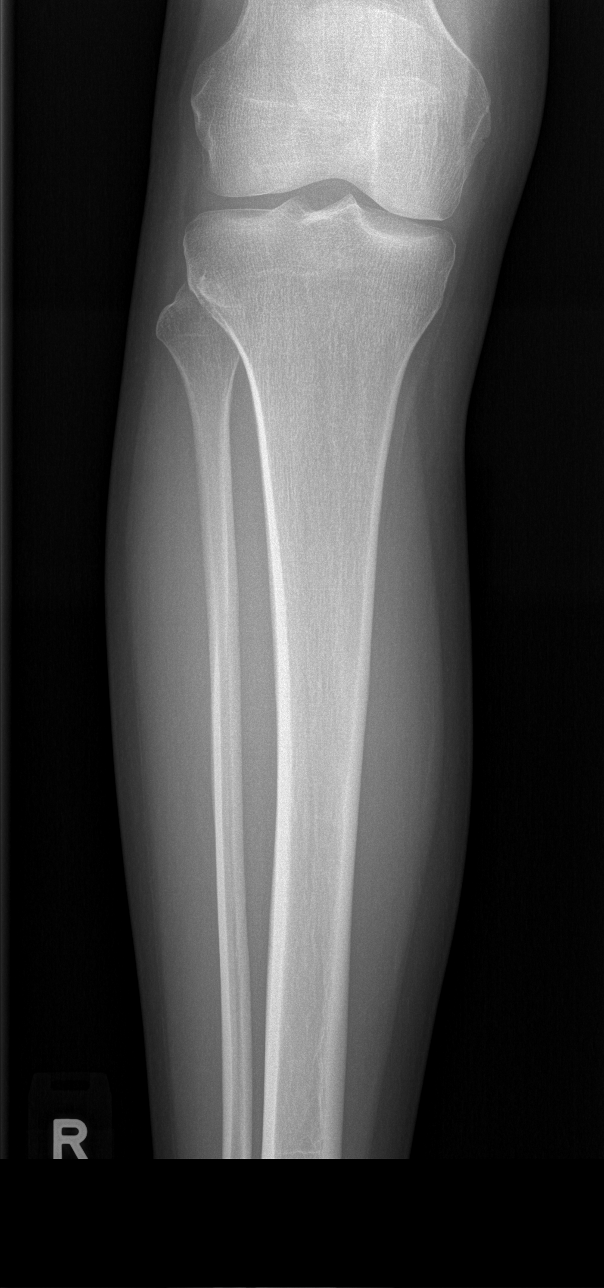

[t tib/fib ap right (2 of 2)]
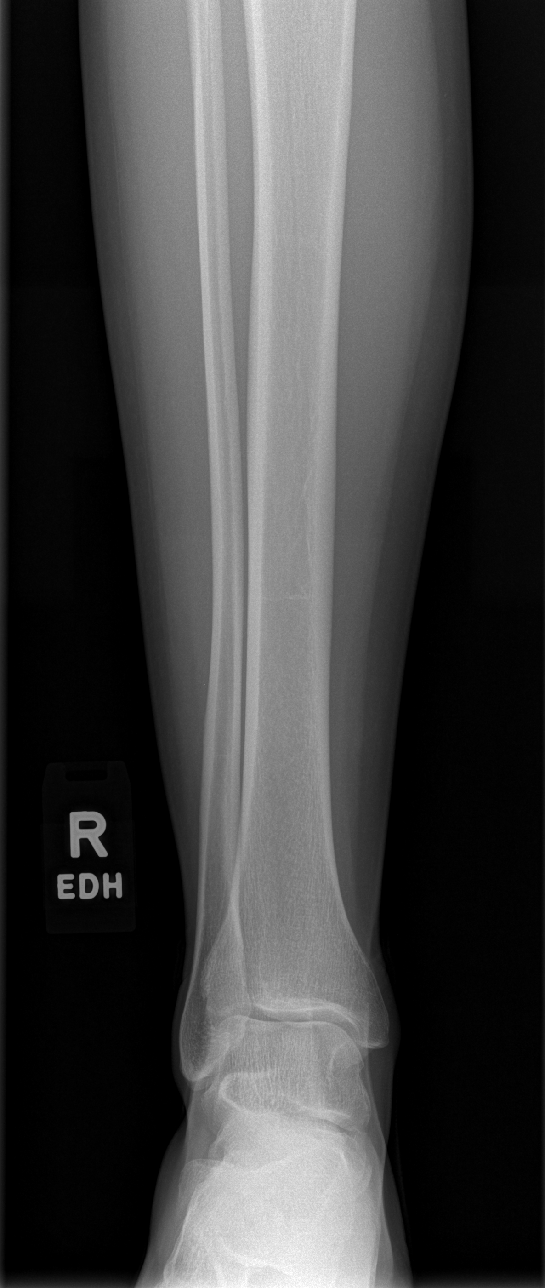

[t tib/fib lat right (1 of 2)]
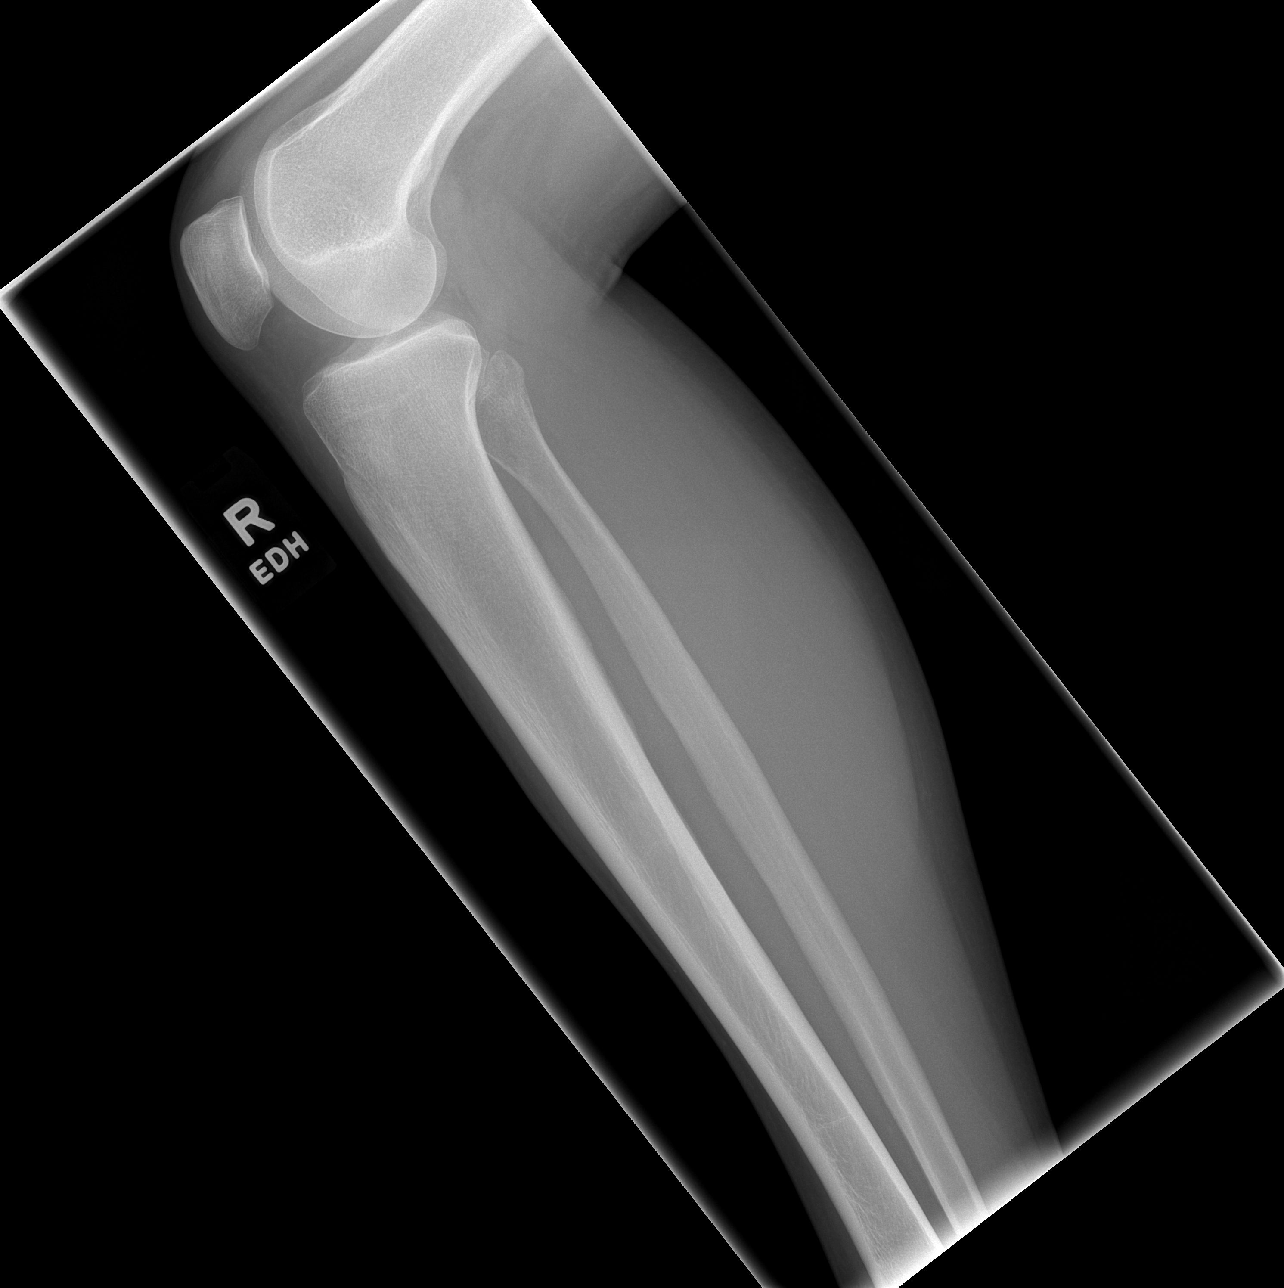

[t tib/fib lat right (2 of 2)]
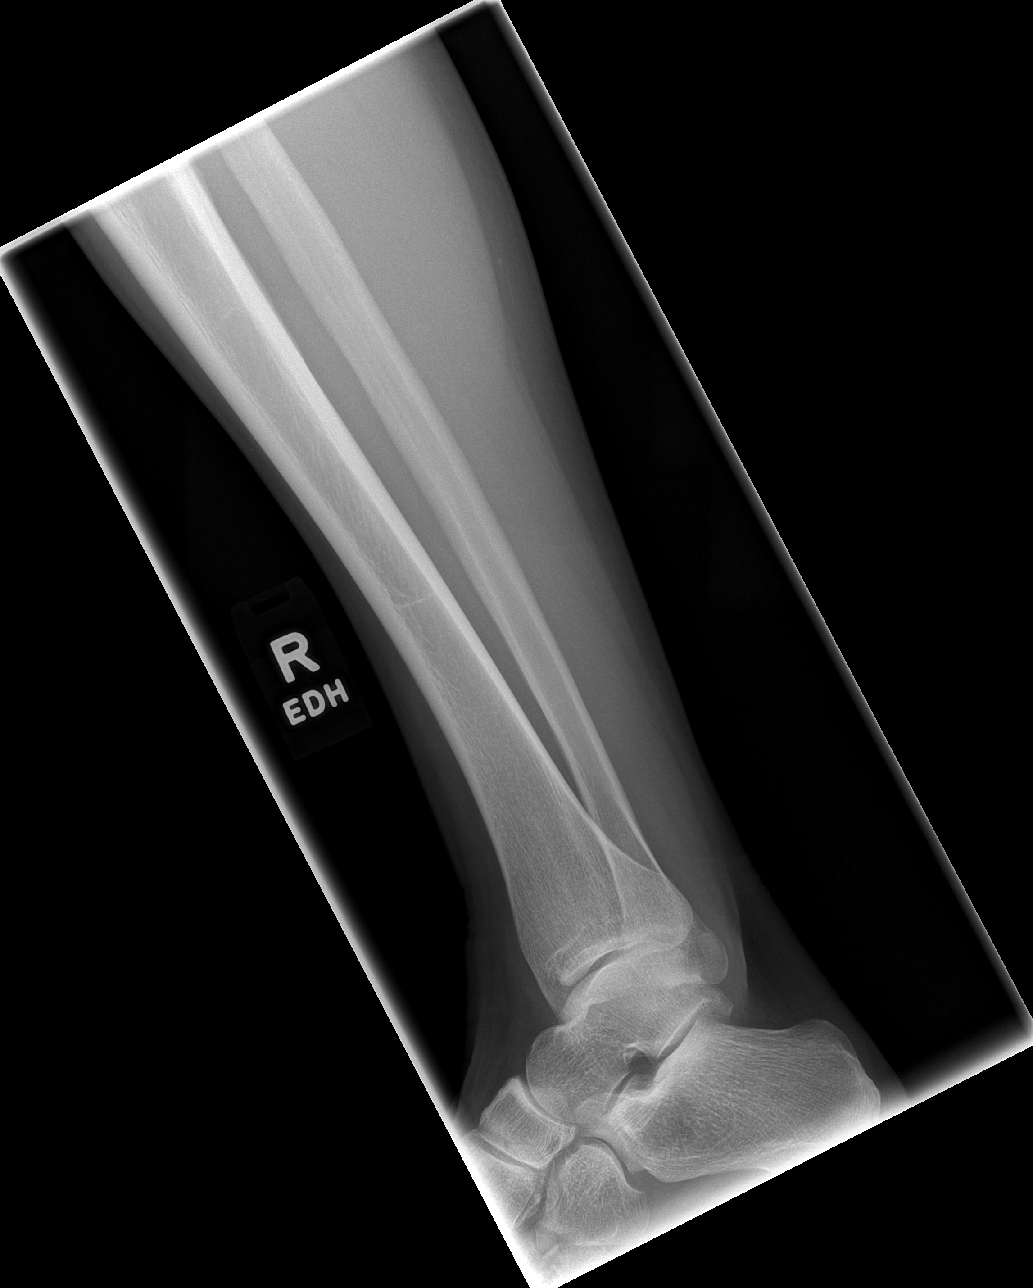

[4 of 4 positions shown; findings below may reference images not displayed]

FINDINGS: No fracture or dislocation of the right tibia or fibula. Included
joint spaces are preserved. Soft tissues are unremarkable.
IMPRESSION: No fracture or dislocation of the right tibia or fibula.

## 2023-12-24 ENCOUNTER — Encounter: Payer: Self-pay | Admitting: Physical Therapy

## 2023-12-24 ENCOUNTER — Other Ambulatory Visit: Payer: Self-pay

## 2023-12-24 ENCOUNTER — Ambulatory Visit: Payer: 59 | Attending: Family Medicine | Admitting: Physical Therapy

## 2023-12-24 DIAGNOSIS — M6281 Muscle weakness (generalized): Secondary | ICD-10-CM | POA: Diagnosis not present

## 2023-12-24 DIAGNOSIS — R32 Unspecified urinary incontinence: Secondary | ICD-10-CM | POA: Diagnosis present

## 2023-12-24 DIAGNOSIS — R279 Unspecified lack of coordination: Secondary | ICD-10-CM | POA: Insufficient documentation

## 2023-12-24 NOTE — Therapy (Signed)
 OUTPATIENT PHYSICAL THERAPY FEMALE PELVIC EVALUATION   Patient Name: Betty Mendoza MRN: [id number] DOB:04/06/1981, 43 y.o., female Today's Date: 12/24/2023  END OF SESSION:  PT End of Session - 12/24/23 1654     Visit Number 1    Authorization Type [employer]    PT Start Time 1100    PT Stop Time 1155    PT Time Calculation (min) 55 min    Activity Tolerance Patient tolerated treatment well    Behavior During Therapy WFL for tasks assessed/performed             Past Medical History:  Diagnosis Date   Crohn disease (HCC)    History reviewed. No pertinent surgical history. Patient Active Problem List   Diagnosis Date Noted   Palpitations 10/31/2016    PCP:  Joen Gentry, MD   REFERRING PROVIDER: Gentry Joen, MD  REFERRING DIAG: R32 (ICD-10-CM) - Unspecified urinary incontinence  THERAPY DIAG:  Muscle weakness (generalized)  Unspecified lack of coordination  Rationale for Evaluation and Treatment: Rehabilitation  ONSET DATE: 6-8 months ago   SUBJECTIVE:                                                                                                                                                                                           SUBJECTIVE STATEMENT: Patient reports that in the last 6-8 months she has noticed decreased urinary control when she has a strong sense of urinary urgency. Yesterday, pt was coming home from the gym and she was unable to get to the bathroom in time due to urgency, resulting in total bladder loss. This is happening more frequently. Sometimes she is able to stop the flow of urine. Pt lifts 6 days per week and will be doing her first bodybuilding show this year.  Fluid intake: Yes: 2 gal/day      PAIN:  Are you having pain? No NPRS scale: 0/10  Pain location: N/A  PRECAUTIONS: None  RED FLAGS: None   WEIGHT BEARING RESTRICTIONS: No  FALLS:  Has patient fallen in last 6 months? 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   PLOF: Independent  PATIENT GOALS: to decrease the urinary leakage   PERTINENT HISTORY:  COLON SURGERY        SMALL INTESTINE SURGERY        TONSILLECTOMY        ABDOMINAL SURGERY        COLONOSCOPY        ILEOCOLECTOMY 06/27/2020    Chrons Disease  Last bowel resection was in 2020 Sexual abuse: No  BOWEL MOVEMENT: Pain with bowel movement: Yes Type of bowel movement:Type (Bristol Stool Scale) BRISTOL STOOL SCALE type 7, Frequency 7-13x/day is normal, Strain No, and Splinting no Fully empty rectum: Yes:   Leakage: Yes: 1x/wk, large in amount  Pads: Yes: if she is squatting at the gym  Fiber supplement: Yes: metamucil daily, takes ammodium if over 15 bowel movements per day   URINATION: Pain with urination: No Fully empty bladder: No - dual collecting system  Stream: Strong Urgency: Yes: high urgency causes leakage  Frequency: 13-15/day Leakage: Urge to void Pads: Yes: only wears when at public or at the gym   INTERCOURSE: Pain with intercourse: During Penetration and Pain Interrupts Intercourse Ability to have vaginal penetration:  Yes:   Climax: yes Marinoff Scale: 2/3  PREGNANCY: Never been pregnant or had children   PROLAPSE: None Hx of prolapsed rectum in 2012  OBJECTIVE:  Note: Objective measures were completed at Evaluation unless otherwise noted.  COGNITION: Overall cognitive status: Within functional limits for tasks assessed     SENSATION: Light touch: Deficits left pelvic floor musculature decreased sensation due to previous surgical operations  Proprioception: Appears intact  MUSCLE LENGTH: WNL  FUNCTIONAL TESTS:  Squat: within normal limits   POSTURE: No Significant postural limitations  PELVIC ALIGNMENT: WNL  LUMBARAROM/PROM:  A/PROM A/PROM  eval  Flexion 25% limited  Extension 25% limited  Right lateral flexion   Left lateral flexion   Right rotation   Left  rotation    (Blank rows = within normal limits)  LOWER EXTREMITY ROM: WNL  PALPATION:   General  increased muscle tone noted in bilateral adductors/hip flexors, no tenderness to palpation                 External Perineal Exam mild dryness noted externally, clitoral hood mobility within normal limits, no TTP with external palpation                              Internal Pelvic Floor: general overactivity noted throughout superficial and deep pelvic floor musculature, no pain with palpation throughout, pt tends to co-contract transverse abdominis and pelvic floor musculature   Patient confirms identification and approves PT to assess internal pelvic floor and treatment Yes No emotional/communication barriers or cognitive limitation. Patient is motivated to learn. Patient understands and agrees with treatment goals and plan. PT explains patient will be examined in standing, sitting, and lying down to see how their muscles and joints work. When they are ready, they will be asked to remove their underwear so PT can examine their perineum. The patient is also given the option of providing their own chaperone as one is not provided in our facility. The patient also has the right and is explained the right to defer or refuse any part of the evaluation or treatment including the internal exam. With the patient's consent, PT will use one gloved finger to gently assess the muscles of the pelvic floor, seeing how well it contracts and relaxes and if there is muscle symmetry. After, the patient will get dressed and PT and patient will discuss exam findings and plan of care. PT and patient discuss plan of care, schedule, attendance policy and HEP activities.   PELVIC MMT:   MMT eval  Vaginal 3/5, 10 quick flicks, 6 second hold  Internal Anal Sphincter   External Anal Sphincter   Puborectalis   Diastasis Recti None found at above or below umbilicus  (  Blank rows = not tested) TONE: General overactivity  noted in pelvic floor musculature - both superficial and deep   PROLAPSE: N/A  TODAY'S TREATMENT:                                                                                                                              DATE:  EVAL 12/24/23: Examination completed, findings reviewed, pt educated on POC, HEP, and *. Pt motivated to participate in PT and agreeable to attempt recommendations.  Neuromuscular re-education: Supine pelvic floor lengthening with diaphragmatic breathing to increase pelvic floor musculature ROM 2x10 Self care  pelvic floor active range of motion, exhalation during exertion to decrease pelvic floor strain during lifting   PATIENT EDUCATION:  Education details: pelvic floor active range of motion, exhalation during exertion to decrease pelvic floor strain during lifting  Person educated: Patient Education method: Explanation, Demonstration, Tactile cues, Verbal cues, and Handouts Education comprehension: verbalized understanding, returned demonstration, verbal cues required, and tactile cues required  HOME EXERCISE PROGRAM: Access Code: [email or web address] URL: [email or web address] Date: 12/24/2023 Prepared by: Celena Domino  Exercises - Supine Diaphragmatic Breathing with Pelvic Floor Lengthening  - 1 x daily - 7 x weekly - 3 sets - 10 reps  ASSESSMENT:  CLINICAL IMPRESSION: Patient is a 43 y.o. female who was seen today for physical therapy evaluation and treatment for urge urinary incontinence. Patient consented to internal pelvic floor assessment vaginally this date and found to have decreased strength, endurance, and coordination. Patient benefited from verbal cues for improved technique with pelvic floor lengthening during inhalation to achieve full pelvic floor range of motion. Patient demonstrates mild overactivity of pelvic floor  musculature limiting her full range of motion. No pain during today's examination and pt tolerated treatment well. Pt would  benefit from additional PT to further address deficits.      OBJECTIVE IMPAIRMENTS: decreased coordination, decreased endurance, decreased ROM, and decreased strength.   ACTIVITY LIMITATIONS: continence  PARTICIPATION LIMITATIONS:  none  PERSONAL FACTORS: Fitness and 1 comorbidity: Chrons disease  are also affecting patient's functional outcome.   REHAB POTENTIAL: Good  CLINICAL DECISION MAKING: Stable/uncomplicated  EVALUATION COMPLEXITY: Low   GOALS: Goals reviewed with patient? Yes  SHORT TERM GOALS: Target date: 01/21/2024  Pt will be independent with HEP.  Baseline: Goal status: INITIAL  2.  Pt will be able to teach back and utilize urge suppression technique in order to help reduce number of trips to the bathroom.   Baseline:  Goal status: INITIAL  3.  Pt will be independent with diaphragmatic breathing and down training activities in order to improve pelvic floor relaxation and range of motion. Baseline:  Goal status: INITIAL  LONG TERM GOALS: Target date: 06/22/2024  Pt will be independent with advanced HEP.  Baseline:  Goal status: INITIAL  2.  Pt will report decreased urinary frequency to 8-10 voids per day to improve general bladder habits and quality of life.  Baseline:  Goal status:  INITIAL  3.  Pt to demonstrate at least 4/5 pelvic floor strength for improved pelvic stability and decreased strain at pelvic floor/ decrease leakage.  Baseline: 3/5 Goal status: INITIAL  4. Pt to demonstrate improved coordination of pelvic floor and breathing mechanics with 10# squat with appropriate synergistic patterns to decrease pain and leakage at least 75% of the time.   Baseline:  Goal status: INITIAL  PLAN:  PT FREQUENCY: 1x/week  PT DURATION: 8 weeks  PLANNED INTERVENTIONS: 97110-Therapeutic exercises, 97530- Therapeutic activity, 97112- Neuromuscular re-education, 97535- Self Care, [id number]- Manual therapy, [phone]- Aquatic Therapy, [phone]- Ultrasound,  Patient/Family education, Taping, Dry Needling, Joint mobilization, Spinal mobilization, Scar mobilization, Cryotherapy, and Moist heat  PLAN FOR NEXT SESSION: discuss vaginal moisturizers, introduce urge drill, continued internal manual interventions to assess pelvic floor lengthening, introduce pelvic floor musculature shortening with exhalation  Celena Domino, PT, DPT 12/24/23 4:56 PM

## 2023-12-31 ENCOUNTER — Ambulatory Visit: Payer: 59 | Admitting: Physical Therapy

## 2023-12-31 ENCOUNTER — Encounter: Payer: Self-pay | Admitting: Physical Therapy

## 2023-12-31 DIAGNOSIS — R279 Unspecified lack of coordination: Secondary | ICD-10-CM

## 2023-12-31 DIAGNOSIS — R32 Unspecified urinary incontinence: Secondary | ICD-10-CM | POA: Diagnosis not present

## 2023-12-31 DIAGNOSIS — M6281 Muscle weakness (generalized): Secondary | ICD-10-CM

## 2023-12-31 NOTE — Patient Instructions (Addendum)
Urge Incontinence  Ideal urination frequency is every 2-4 wakeful hours, which equates to 5-8 times within a 24-hour period.   Urge incontinence is leakage that occurs when the bladder muscle contracts, creating a sudden need to go before getting to the bathroom.   Going too often when your bladder isn't actually full can disrupt the body's automatic signals to store and hold urine longer, which will increase urgency/frequency.  In this case, the bladder "is running the show" and strategies can be learned to retrain this pattern.   One should be able to control the first urge to urinate, at around .  The bladder can hold up to a "grande latte," or . To help you gain control, practice the Urge Drill below when urgency strikes.  This drill will help retrain your bladder signals and allow you to store and hold urine longer.  The overall goal is to stretch out your time between voids to reach a more manageable voiding schedule.    Practice your "quick flicks" often throughout the day (each waking hour) even when you don't need feel the urge to go.  This will help strengthen your pelvic floor muscles, making them more effective in controlling leakage.  Urge Drill  When you feel an urge to go, follow these steps to regain control: Stop what you are doing and be still Take one deep breath, directing your air into your abdomen Think an affirming thought, such as "I've got this." Do 5 quick flicks of your pelvic floor Walk with control to the bathroom to void, or delay voiding  Moisturizers They are used in the vagina to hydrate the mucous membrane that make up the vaginal canal. Designed to keep a more normal acid balance (ph) Once placed in the vagina, it will last between two to three days.  Use 2-3 times per week at bedtime  Ingredients to avoid is glycerin and fragrance, can increase chance of infection Should not be used just before sex due to causing irritation Most are gels  administered either in a tampon-shaped applicator or as a vaginal suppository. They are non-hormonal.   Types of Moisturizers(internal use)  Vitamin E vaginal suppositories- Whole foods, Amazon Moist Again Coconut oil- can break down condoms, any grocery store (prefer organic) Julva- (Do no use if taking  Tamoxifen) amazon Yes moisturizer- amazon NeuEve Silk , NeuEve Silver for menopausal or over 65 (if have severe vaginal atrophy or cancer treatments use NeuEve Silk for  1 month than move to [employer])- [employer], [place].[employer] Olive and Bee intimate cream- [email or web address].com.au Mae vaginal moisturizer- Amazon Aloe Good Clean Love Hyaluronic acid Hyalofemme Reveree hyaluronic acid inserts   Creams to use externally on the Vulva area Marathon Oil (good for for cancer patients that had radiation to the area)- [place] or [email or web address]/ Vulva Balm/ V-magic cream by medicine mama- amazon Julva-amazon Vital "V Wild Yam salve ( help moisturize and help with thinning vulvar area, does have Beeswax MoodMaid Botanical Pro-Meno Wild Yam Cream- Amazon Desert Harvest Gele Cleo by Zane Herald labial moisturizer (Amazon),  Coconut or olive oil aloe Good Clean Love Enchanted Rose by intimate rose  Things to avoid in the vaginal area Do not use things to irritate the vulvar area No lotions just specialized creams for the vulva area- Neogyn, V-magic,  No soaps; can use Aveeno or Calendula cleanser, unscented Dove if needed. Must be gentle No deodorants No douches Good to sleep without underwear to let the vaginal area to air out No scrubbing:  spread the lips to let warm water rinse over labias and pat dry    THE KNACK  The Knack is a strategy you may use to help to reduce or prevent leakage or passing of urine, gas or feces during an activity that causes downward force on the pelvic floor muscles.    Activities that can cause downward pressure on the pelvic floor muscles  include coughing, sneezing, laughing, bending, lifting, and transitioning from different body positions such as from laying down to sitting up and sitting to standing.  To perform The Knack, consciously squeeze and lift your pelvic floor muscles to perform a strong, well-timed pelvic muscle contraction BEFORE AND DURING these activities above.  As your contraction gets more coordinated and your muscles get stronger, you will become more effective in controlling your experience of incontinence or gas passing during these activities.
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
## 2024-01-07 ENCOUNTER — Ambulatory Visit: Payer: 59 | Admitting: Physical Therapy

## 2024-01-07 DIAGNOSIS — M6281 Muscle weakness (generalized): Secondary | ICD-10-CM

## 2024-01-07 DIAGNOSIS — R279 Unspecified lack of coordination: Secondary | ICD-10-CM

## 2024-01-07 DIAGNOSIS — R32 Unspecified urinary incontinence: Secondary | ICD-10-CM | POA: Diagnosis not present

## 2024-01-07 NOTE — Therapy (Signed)
OUTPATIENT PHYSICAL THERAPY FEMALE PELVIC TREATMENT   Patient Name: Betty Mendoza MRN: [id number] DOB:1981/08/15, 43 y.o., female Today's Date: 01/07/2024  END OF SESSION:  PT End of Session - 01/07/24 0842     Visit Number 3    Authorization Type [employer]    PT Start Time 0845    PT Stop Time 0927    PT Time Calculation (min) 42 min    Activity Tolerance Patient tolerated treatment well    Behavior During Therapy WFL for tasks assessed/performed              Past Medical History:  Diagnosis Date   Crohn disease (HCC)    No past surgical history on file. Patient Active Problem List   Diagnosis Date Noted   Palpitations 10/31/2016    PCP:  Lupita Raider, MD   REFERRING PROVIDER: Lupita Raider, MD  REFERRING DIAG: R32 (ICD-10-CM) - Unspecified urinary incontinence  THERAPY DIAG:  Muscle weakness (generalized)  Unspecified lack of coordination  Rationale for Evaluation and Treatment: Rehabilitation  ONSET DATE: 6-8 months ago   SUBJECTIVE:                                                                                                                                                                                           SUBJECTIVE STATEMENT: Patient reports that she did a full body workout Sunday and she is sore all over today. No urinary accidents since last visit, patient is very pleased with this. She struggled with her exercises this week regarding pelvic floor control. Every time she does decline sit ups, patient will leak stool. Patient enjoyed rock climbing over the weekend - she was able to descend the walls without fecal or urinary leakage. Patient also tried bouldering this weekend and had no leaks - this involves a lot of hip abduction and external rotation. 3 Bms today already.  Fluid intake: Yes: 2 gal/day      PAIN:  Are you having pain? No NPRS scale: 0/10  Pain location: N/A  PRECAUTIONS: None  RED FLAGS: None   WEIGHT  BEARING RESTRICTIONS: No  FALLS:  Has patient fallen in last 6 months? 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   PLOF: Independent  PATIENT GOALS: to decrease the urinary leakage   PERTINENT HISTORY:  COLON SURGERY        SMALL INTESTINE SURGERY        TONSILLECTOMY        ABDOMINAL SURGERY        COLONOSCOPY        ILEOCOLECTOMY  06/27/2020    Chrons Disease  Last bowel resection was in 2020 Sexual abuse: No  BOWEL MOVEMENT: Pain with bowel movement: Yes Type of bowel movement:Type (Bristol Stool Scale) BRISTOL STOOL SCALE type 7, Frequency 7-13x/day is normal, Strain No, and Splinting no Fully empty rectum: Yes:   Leakage: Yes: 1x/wk, large in amount  Pads: Yes: if she is squatting at the gym  Fiber supplement: Yes: metamucil daily, takes ammodium if over 15 bowel movements per day   URINATION: Pain with urination: No Fully empty bladder: No - dual collecting system  Stream: Strong Urgency: Yes: high urgency causes leakage  Frequency: 13-15/day Leakage: Urge to void Pads: Yes: only wears when at public or at the gym   INTERCOURSE: Pain with intercourse: During Penetration and Pain Interrupts Intercourse Ability to have vaginal penetration:  Yes:   Climax: yes Marinoff Scale: 2/3  PREGNANCY: Never been pregnant or had children   PROLAPSE: None Hx of prolapsed rectum in 2012  OBJECTIVE:  Note: Objective measures were completed at Evaluation unless otherwise noted.  COGNITION: Overall cognitive status: Within functional limits for tasks assessed     SENSATION: Light touch: Deficits left pelvic floor musculature decreased sensation due to previous surgical operations  Proprioception: Appears intact  MUSCLE LENGTH: WNL  FUNCTIONAL TESTS:  Squat: within normal limits   POSTURE: No Significant postural limitations  PELVIC ALIGNMENT: WNL  LUMBARAROM/PROM:  A/PROM A/PROM  eval  Flexion 25%  limited  Extension 25% limited  Right lateral flexion   Left lateral flexion   Right rotation   Left rotation    (Blank rows = within normal limits)  LOWER EXTREMITY ROM: WNL  PALPATION:   General  increased muscle tone noted in bilateral adductors/hip flexors, no tenderness to palpation                 External Perineal Exam mild dryness noted externally, clitoral hood mobility within normal limits, no TTP with external palpation                              Internal Pelvic Floor: general overactivity noted throughout superficial and deep pelvic floor musculature, no pain with palpation throughout, pt tends to co-contract transverse abdominis and pelvic floor musculature   Patient confirms identification and approves PT to assess internal pelvic floor and treatment Yes No emotional/communication barriers or cognitive limitation. Patient is motivated to learn. Patient understands and agrees with treatment goals and plan. PT explains patient will be examined in standing, sitting, and lying down to see how their muscles and joints work. When they are ready, they will be asked to remove their underwear so PT can examine their perineum. The patient is also given the option of providing their own chaperone as one is not provided in our facility. The patient also has the right and is explained the right to defer or refuse any part of the evaluation or treatment including the internal exam. With the patient's consent, PT will use one gloved finger to gently assess the muscles of the pelvic floor, seeing how well it contracts and relaxes and if there is muscle symmetry. After, the patient will get dressed and PT and patient will discuss exam findings and plan of care. PT and patient discuss plan of care, schedule, attendance policy and HEP activities.  PELVIC MMT:   MMT eval  Vaginal 3/5, 10 quick flicks, 6 second hold  Internal Anal Sphincter   External  Anal Sphincter   Puborectalis   Diastasis  Recti None found at above or below umbilicus  (Blank rows = not tested) TONE: General overactivity noted in pelvic floor musculature - both superficial and deep   PROLAPSE: N/A  TODAY'S TREATMENT:                                                                                                                              DATE:  EVAL 12/24/23: Examination completed, findings reviewed, pt educated on POC, HEP, and *. Pt motivated to participate in PT and agreeable to attempt recommendations.  Neuromuscular re-education: Supine pelvic floor lengthening with diaphragmatic breathing to increase pelvic floor musculature ROM 2x10 Self care  pelvic floor active range of motion, exhalation during exertion to decrease pelvic floor strain during lifting   12/31/23: Neuromuscular re-education: Supine pelvic floor lengthening and shortening with diaphragmatic breathing for pelvic floor AROM 2x5 breaths  2x10 quick flicks for pelvic floor engagement Therapeutic activity Vaginal moisturizers education Knack technique for stress urinary incontinence  Urge drill for urge urinary incontinence   01/07/24: Examination completed, findings reviewed, pt educated on POC, HEP, and *. Pt motivated to participate in PT and agreeable to attempt recommendations.  Neuromuscular re-education: Supine pelvic floor lengthening with diaphragmatic breathing to increase pelvic floor musculature ROM 2x10 Seated pelvic floor lengthening with diaphragmatic breathing to increase pelvic floor musculature ROM 2x10  Sidelying clamshell + reverse clamshell + diaphragmatic breathing 2x12 Reclined internal and external rotation of bilateral hips + diaphragmatic breathing 2x48min   Manual therapy  Internal pelvic floor muscle release bilaterally, levator ani lengthening with inhalation Self care  pelvic floor active range of motion, exhalation during exertion to decrease pelvic floor strain during lifting   PATIENT EDUCATION:   Education details: pelvic floor active range of motion, exhalation during exertion to decrease pelvic floor strain during lifting, vaginal moisturizers, knack technique, urge drill  Person educated: Patient Education method: Explanation, Demonstration, Tactile cues, Verbal cues, and Handouts Education comprehension: verbalized understanding, returned demonstration, verbal cues required, and tactile cues required  HOME EXERCISE PROGRAM: Access Code: [email or web address] URL: [email or web address] Date: 01/07/2024 Prepared by: Earna Coder  Exercises - Supine Hip Internal and External Rotation  - 1 x daily - 7 x weekly - 3 sets - 10 reps - Seated Pelvic Floor Contraction  - 1 x daily - 7 x weekly - 3 sets - 10 reps - Clamshell  - 1 x daily - 7 x weekly - 3 sets - 10 reps - Sidelying Reverse Clamshell  - 1 x daily - 7 x weekly - 3 sets - 10 reps  ASSESSMENT:  CLINICAL IMPRESSION: Patient is a 43 y.o. female who was seen today for physical therapy treatment for urge urinary incontinence. Patient has had ZERO! instances of leakage since last visit, and has been consistent with HEP. Pt fully consents to internal treatment. Internally, pt was able to actively lengthen her pelvic floor much more efficiently than  previously. Patients AROM of the pelvic floor was much better with diaphragmatic breathing today, so we progressed this to seated and patient found pelvic floor contractions in seated to be more challenging. Clamshell exercise was more challenging on the left side, which could be due to an increase in scar tissue on the left side of patients pelvis. No increase in pain following today's session. Pt would benefit from additional PT to further address deficits.      OBJECTIVE IMPAIRMENTS: decreased coordination, decreased endurance, decreased ROM, and decreased strength.   ACTIVITY LIMITATIONS: continence  PARTICIPATION LIMITATIONS:  none  PERSONAL FACTORS: Fitness and 1 comorbidity:  Chrons disease  are also affecting patient's functional outcome.   REHAB POTENTIAL: Good  CLINICAL DECISION MAKING: Stable/uncomplicated  EVALUATION COMPLEXITY: Low   GOALS: Goals reviewed with patient? Yes  SHORT TERM GOALS: Target date: 01/21/2024  Pt will be independent with HEP.  Baseline: Goal status: INITIAL  2.  Pt will be able to teach back and utilize urge suppression technique in order to help reduce number of trips to the bathroom.   Baseline:  Goal status: INITIAL  3.  Pt will be independent with diaphragmatic breathing and down training activities in order to improve pelvic floor relaxation and range of motion. Baseline:  Goal status: INITIAL  LONG TERM GOALS: Target date: 06/22/2024  Pt will be independent with advanced HEP.  Baseline:  Goal status: INITIAL  2.  Pt will report decreased urinary frequency to 8-10 voids per day to improve general bladder habits and quality of life.  Baseline:  Goal status: INITIAL  3.  Pt to demonstrate at least 4/5 pelvic floor strength for improved pelvic stability and decreased strain at pelvic floor/ decrease leakage.  Baseline: 3/5 Goal status: INITIAL  4. Pt to demonstrate improved coordination of pelvic floor and breathing mechanics with 10# squat with appropriate synergistic patterns to decrease pain and leakage at least 75% of the time.   Baseline:  Goal status: INITIAL  PLAN:  PT FREQUENCY: 1x/week  PT DURATION: 8 weeks  PLANNED INTERVENTIONS: 97110-Therapeutic exercises, 97530- Therapeutic activity, 97112- Neuromuscular re-education, 97535- Self Care, 16109- Manual therapy, [phone]- Aquatic Therapy, [phone]- Ultrasound, Patient/Family education, Taping, Dry Needling, Joint mobilization, Spinal mobilization, Scar mobilization, Cryotherapy, and Moist heat  PLAN FOR NEXT SESSION: see how urge drill has been going; continued internal manual interventions to assess pelvic floor lengthening and shortening with  diaphragmatic breathing, seated pelvic floor AROM with breathing, introduce clamshells  Earna Coder, PT, DPT 01/07/24 9:33 AM

## 2024-01-14 ENCOUNTER — Ambulatory Visit: Payer: 59 | Admitting: Physical Therapy

## 2024-01-14 DIAGNOSIS — R32 Unspecified urinary incontinence: Secondary | ICD-10-CM | POA: Diagnosis not present

## 2024-01-14 DIAGNOSIS — M6281 Muscle weakness (generalized): Secondary | ICD-10-CM

## 2024-01-14 DIAGNOSIS — R279 Unspecified lack of coordination: Secondary | ICD-10-CM

## 2024-01-14 NOTE — Patient Instructions (Signed)
 Bowel Control and Holding On When bowel control is decreased or lost it is helpful to understand some control techniques. Here are some tips for controlling your bowel movements.  Choose the best time of day to have a bowel movement:  Usually the best time of day for a bowel movement will be a half hour to an hour after breakfast.  For some people, a half hour to an hour after lunch will work better.  These times are best because the body uses the gastrocolic reflex, a stimulation of bowel motion that occurs with eating, to help produce a bowel movement.  Make sure that you are not rushed and have convenient access to a bathroom at your selected time.  Eat all your meals at a predictable time each day.  The bowel functions best when food is introduced at the same regular intervals.  The amount of food eaten at a given time of day should be about the same size from day to day.  The bowel functions best when food is introduced in similar quantities from day to day.  It is fine to have a small breakfast and a large lunch, or vice versa, just be consistent.  Eat two servings of fruit or vegetables and at least one serving of complex carbohydrates (whole grains such as brown rice, bran, whole wheat bread, or oatmeal) at each meal.   A serving of fruit or vegetables is a half-cup or medium-sized piece of fruit.  A serving of a complex carbohydrate is a half-cup or a slice of bread.  It is often desirable to eat more than the recommended minimum amounts of fruits, vegetables, and complex carbohydrates.  Drink plenty of water--ideally eight glasses a day.  Until regular bowel movements are established at a desired time of day, take 2-3 dried prunes (or  to 1/3 cup of prune juice) each night to stimulate morning bowel function.  Exercise daily.  You may exercise at any time of day, but you may find that bowel function is helped most if the exercise is at a consistent time each day.        Toileting Techniques for Bowel Movements    An Evacuation/Defecation Plan   Here are the 4 basic points:  Lean forward enough for your elbows to rest on your knees Support your feet on the floor or use a low stool if your feet don't touch the floor  Push out your belly as if you have swallowed a beach ball--you should feel a widening of your waist. "Belly Big, Belly Hard" Open and relax your pelvic floor muscles, rather than tightening around the anus  While you are sitting on the toilet pay attention to the following areas: Jaw and mouth position- relaxed not clenched Angle of your hips - leaning slightly forward Whether your feet touch the ground or not - should be flat and supported Arm placement - rest against your thighs Spine position - flat back Waist Breathing - exhale as you push (like blowing up a balloon or try using other sounds such as ahhhh, shhhhh, ohhhh or grrrrrrr) Irven Shelling - hard and tight as you push Anus (opening of the anal canal) - relaxed and open as you push Anus - Tighten and lift pulling the muscle back in after you are done or if taking a break  If you are not successful after 10-15 minutes, try again later.  Avoid negative self-talk about your toileting experience.   Read this for more details and ask  your PT if you need suggestions for adjustments or limitations:  Sitting on the toilet  a) Make sure your feet are supported - flat on the floor or step stool b) Many people find it effective to lean forward or raise their knees.  Propping your feet on a step stool (Squatty Potty is a brand name) can help the muscles around the anus to relax  c) When you lean forward, place your forearms on your thighs for support  Relaxing Breathe deeply and slowly in through your nose and out through your mouth. To become aware of how to relax your muscles, contracting and releasing muscles can be helpful.  Pull your pelvic floor muscles in tightly by using the image of  holding back gas, or closing around the anus (visualize making a circle smaller) and lifting the anus up and in.  Then release the muscles and your anus should drop down and feel open. Repeat 5 times ending with the feeling of relaxation. Keep your pelvic floor muscles relaxed; let your belly bulge out. The digestive tract starts at the mouth and ends at the anal opening, so be sure to relax both ends of the tube.  Place your tongue on the roof of your mouth with your teeth separated.  This helps relax your mouth and will help to relax the anus at the same time.  Emptying (defecation) a) Keep your pelvic floor and sphincter relaxed, then bulge your anal muscles.  Make the anal opening wide.  b) Stick your belly out as if you have swallowed a beach ball. c) Make your belly wall hard using your belly muscles while continuing to breathe. Doing this makes it easier to open your anus. d) Breath out and give a grunt (or try using other sounds such as ahhhh, shhhhh, ohhhh or grrrrrrr). e)  Can also try to act as if you are blowing up a balloon as you push  4) Finishing a) As you finish your bowel movement, pull the pelvic floor muscles up and in.  This will leave your anus in the proper place rather than remaining pushed out and down. If you leave your anus pushed out and down, it will start to feel as though that is normal and give you incorrect signals about needing to have a bowel movement.

## 2024-01-14 NOTE — Therapy (Signed)
 OUTPATIENT PHYSICAL THERAPY FEMALE PELVIC TREATMENT   Patient Name: Betty Mendoza MRN: [id number] DOB:1981-05-05, 43 y.o., female Today's Date: 01/14/2024  END OF SESSION:  PT End of Session - 01/14/24 0930     Visit Number 4    Authorization Type [employer]    PT Start Time [phone]    PT Stop Time 0927    PT Time Calculation (min) 40 min    Activity Tolerance Patient tolerated treatment well    Behavior During Therapy WFL for tasks assessed/performed               Past Medical History:  Diagnosis Date   Crohn disease (HCC)    No past surgical history on file. Patient Active Problem List   Diagnosis Date Noted   Palpitations 10/31/2016    PCP:  Lupita Raider, MD   REFERRING PROVIDER: Lupita Raider, MD  REFERRING DIAG: R32 (ICD-10-CM) - Unspecified urinary incontinence  THERAPY DIAG:  Muscle weakness (generalized)  Unspecified lack of coordination  Rationale for Evaluation and Treatment: Rehabilitation  ONSET DATE: 6-8 months ago   SUBJECTIVE:                                                                                                                                                                                           SUBJECTIVE STATEMENT: Patient reports that she hit a PR this morning which she is pleased with. No accidents to report. Sitting up exercises are more difficult for her than in supine. She can feel her pelvic floor moving more effectively recently. No fecal leakage to report either, which she is very pleased with. She thinks the pelvic floor muscles around her rectum are "angry". She will have rectal spasms after bowel movements or if she cannot get to the bathroom quick enough. This has not happened in a long time.  Fluid intake: Yes: 2 gal/day      PAIN:  Are you having pain? No NPRS scale: 0/10  Pain location: N/A  PRECAUTIONS: None  RED FLAGS: None   WEIGHT BEARING RESTRICTIONS: No  FALLS:  Has patient fallen in  last 6 months? 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   PLOF: Independent  PATIENT GOALS: to decrease the urinary leakage   PERTINENT HISTORY:  COLON SURGERY        SMALL INTESTINE SURGERY        TONSILLECTOMY        ABDOMINAL SURGERY        COLONOSCOPY        ILEOCOLECTOMY 06/27/2020    Chrons Disease  Last bowel resection was in 2020 Sexual abuse: No  BOWEL MOVEMENT: Pain with bowel movement: Yes Type of bowel movement:Type (Bristol Stool Scale) BRISTOL STOOL SCALE type 7, Frequency 7-13x/day is normal, Strain No, and Splinting no Fully empty rectum: Yes:   Leakage: Yes: 1x/wk, large in amount  Pads: Yes: if she is squatting at the gym  Fiber supplement: Yes: metamucil daily, takes ammodium if over 15 bowel movements per day   URINATION: Pain with urination: No Fully empty bladder: No - dual collecting system  Stream: Strong Urgency: Yes: high urgency causes leakage  Frequency: 13-15/day Leakage: Urge to void Pads: Yes: only wears when at public or at the gym   INTERCOURSE: Pain with intercourse: During Penetration and Pain Interrupts Intercourse Ability to have vaginal penetration:  Yes:   Climax: yes Marinoff Scale: 2/3  PREGNANCY: Never been pregnant or had children   PROLAPSE: None Hx of prolapsed rectum in 2012  OBJECTIVE:  Note: Objective measures were completed at Evaluation unless otherwise noted.  COGNITION: Overall cognitive status: Within functional limits for tasks assessed     SENSATION: Light touch: Deficits left pelvic floor musculature decreased sensation due to previous surgical operations  Proprioception: Appears intact  MUSCLE LENGTH: WNL  FUNCTIONAL TESTS:  Squat: within normal limits   POSTURE: No Significant postural limitations  PELVIC ALIGNMENT: WNL  LUMBARAROM/PROM:  A/PROM A/PROM  eval  Flexion 25% limited  Extension 25% limited  Right lateral flexion    Left lateral flexion   Right rotation   Left rotation    (Blank rows = within normal limits)  LOWER EXTREMITY ROM: WNL  PALPATION:   General  increased muscle tone noted in bilateral adductors/hip flexors, no tenderness to palpation                 External Perineal Exam mild dryness noted externally, clitoral hood mobility within normal limits, no TTP with external palpation                              Internal Pelvic Floor: general overactivity noted throughout superficial and deep pelvic floor musculature, no pain with palpation throughout, pt tends to co-contract transverse abdominis and pelvic floor musculature   Patient confirms identification and approves PT to assess internal pelvic floor and treatment Yes No emotional/communication barriers or cognitive limitation. Patient is motivated to learn. Patient understands and agrees with treatment goals and plan. PT explains patient will be examined in standing, sitting, and lying down to see how their muscles and joints work. When they are ready, they will be asked to remove their underwear so PT can examine their perineum. The patient is also given the option of providing their own chaperone as one is not provided in our facility. The patient also has the right and is explained the right to defer or refuse any part of the evaluation or treatment including the internal exam. With the patient's consent, PT will use one gloved finger to gently assess the muscles of the pelvic floor, seeing how well it contracts and relaxes and if there is muscle symmetry. After, the patient will get dressed and PT and patient will discuss exam findings and plan of care. PT and patient discuss plan of care, schedule, attendance policy and HEP activities.  PELVIC MMT:   MMT eval  Vaginal 3/5, 10 quick flicks, 6 second hold  Internal Anal Sphincter 5/5  External Anal Sphincter 3/5  Puborectalis  Diastasis Recti None found at above or below umbilicus   (Blank rows = not tested) TONE: General overactivity noted in pelvic floor musculature - both superficial and deep. Rectally, patient is overactive and tends to hold her muscles in a steady state of contraction.  PROLAPSE: N/A  TODAY'S TREATMENT:                                                                                                                              DATE:  12/31/23: Neuromuscular re-education: Supine pelvic floor lengthening and shortening with diaphragmatic breathing for pelvic floor AROM 2x5 breaths  2x10 quick flicks for pelvic floor engagement Therapeutic activity Vaginal moisturizers education Knack technique for stress urinary incontinence  Urge drill for urge urinary incontinence   01/07/24: Examination completed, findings reviewed, pt educated on POC, HEP, and *. Pt motivated to participate in PT and agreeable to attempt recommendations.  Neuromuscular re-education: Supine pelvic floor lengthening with diaphragmatic breathing to increase pelvic floor musculature ROM 2x10 Seated pelvic floor lengthening with diaphragmatic breathing to increase pelvic floor musculature ROM 2x10  Sidelying clamshell + reverse clamshell + diaphragmatic breathing 2x12 Reclined internal and external rotation of bilateral hips + diaphragmatic breathing 2x76min   Manual therapy  Internal pelvic floor muscle release bilaterally, levator ani lengthening with inhalation Self care  pelvic floor active range of motion, exhalation during exertion to decrease pelvic floor strain during lifting   01/14/24: Neuromuscular re-education: Supine pelvic floor lengthening with diaphragmatic breathing to increase pelvic floor musculature ROM 2x10 Seated pelvic floor lengthening with diaphragmatic breathing to increase pelvic floor musculature ROM 2x10  Sidelying clamshell + reverse clamshell + diaphragmatic breathing 2x12 Reclined internal and external rotation of bilateral hips + diaphragmatic  breathing 2x11min   Manual therapy  Internal rectal examination + muscle release techniques for relaxation  Self care  pelvic floor active range of motion, exhalation during exertion to decrease pelvic floor strain during lifting  How to control fecal leakage and how to breathe when defecating for proper pelvic floor mechanics and lengthening  PATIENT EDUCATION:  Education details: pelvic floor active range of motion, exhalation during exertion to decrease pelvic floor strain during lifting, vaginal moisturizers, knack technique, urge drill  Person educated: Patient Education method: Explanation, Demonstration, Tactile cues, Verbal cues, and Handouts Education comprehension: verbalized understanding, returned demonstration, verbal cues required, and tactile cues required  HOME EXERCISE PROGRAM: Access Code: [email or web address] URL: [email or web address] Date: 01/14/2024 Prepared by: Earna Coder  Exercises - Supine Hip Internal and External Rotation  - 1 x daily - 7 x weekly - 3 sets - 10 reps - Seated Pelvic Floor Contraction  - 1 x daily - 7 x weekly - 3 sets - 10 reps - Clamshell  - 1 x daily - 7 x weekly - 3 sets - 10 reps - Sidelying Reverse Clamshell  - 1 x daily - 7 x weekly - 3 sets - 10 reps - Sidelying Diaphragmatic Breathing  -  1 x daily - 7 x weekly - 3 sets - 10 reps  ASSESSMENT:  CLINICAL IMPRESSION: Patient is a 43 y.o. female who was seen today for physical therapy treatment for urge urinary incontinence. Patient has continued to have zero instances of urinary/fecal leakage since last visit, and has been consistent with HEP. Pt fully consents to internal rectal examination/treatment. Patient was unable to actively lengthen her pelvic floor with rectal palpation at first, but with internal cueing and cueing to "breathe into the pelvis", she was able to achieve 75% of AROM of her pelvic floor rectally. She had some pain on the right side of her pelvic floor with rectal  palpation that decreased with diaphragmatic breathing interventions. Patient provided education on how to control fecal leakage and how to breathe while defecating to correctly utilize pelvic floor mechanics for toileting. Exercises are to remain consistent. Pt would benefit from additional PT to further address deficits.      OBJECTIVE IMPAIRMENTS: decreased coordination, decreased endurance, decreased ROM, and decreased strength.   ACTIVITY LIMITATIONS: continence  PARTICIPATION LIMITATIONS:  none  PERSONAL FACTORS: Fitness and 1 comorbidity: Chrons disease  are also affecting patient's functional outcome.   REHAB POTENTIAL: Good  CLINICAL DECISION MAKING: Stable/uncomplicated  EVALUATION COMPLEXITY: Low   GOALS: Goals reviewed with patient? Yes  SHORT TERM GOALS: Target date: 01/21/2024  Pt will be independent with HEP.  Baseline: Goal status: INITIAL  2.  Pt will be able to teach back and utilize urge suppression technique in order to help reduce number of trips to the bathroom.   Baseline:  Goal status: INITIAL  3.  Pt will be independent with diaphragmatic breathing and down training activities in order to improve pelvic floor relaxation and range of motion. Baseline:  Goal status: INITIAL  LONG TERM GOALS: Target date: 06/22/2024  Pt will be independent with advanced HEP.  Baseline:  Goal status: INITIAL  2.  Pt will report decreased urinary frequency to 8-10 voids per day to improve general bladder habits and quality of life.  Baseline:  Goal status: INITIAL  3.  Pt to demonstrate at least 4/5 pelvic floor strength for improved pelvic stability and decreased strain at pelvic floor/ decrease leakage.  Baseline: 3/5 Goal status: INITIAL  4. Pt to demonstrate improved coordination of pelvic floor and breathing mechanics with 10# squat with appropriate synergistic patterns to decrease pain and leakage at least 75% of the time.   Baseline:  Goal status:  INITIAL  PLAN:  PT FREQUENCY: 1x/week  PT DURATION: 8 weeks  PLANNED INTERVENTIONS: 97110-Therapeutic exercises, 97530- Therapeutic activity, [id number]- Neuromuscular re-education, 97535- Self Care, [id number]- Manual therapy, [phone]- Aquatic Therapy, [phone]- Ultrasound, Patient/Family education, Taping, Dry Needling, Joint mobilization, Spinal mobilization, Scar mobilization, Cryotherapy, and Moist heat  PLAN FOR NEXT SESSION: if no fecal/urinary incontinence, progress exercises. Continued manual interventions rectally/vaginally if experiencing pain or feelings of muscle tension  Earna Coder, PT, DPT 01/14/24 9:30 AM

## 2024-01-21 ENCOUNTER — Ambulatory Visit: Payer: 59 | Admitting: Physical Therapy

## 2024-01-22 ENCOUNTER — Other Ambulatory Visit: Payer: Self-pay | Admitting: Family Medicine

## 2024-01-22 ENCOUNTER — Ambulatory Visit
Admission: RE | Admit: 2024-01-22 | Discharge: 2024-01-22 | Disposition: A | Discharge status: Home / Self Care | Source: Ambulatory Visit | Attending: Family Medicine | Admitting: Family Medicine

## 2024-01-22 DIAGNOSIS — R059 Cough, unspecified: Secondary | ICD-10-CM

## 2024-01-28 ENCOUNTER — Ambulatory Visit: Payer: 59 | Attending: Family Medicine | Admitting: Physical Therapy

## 2024-01-28 DIAGNOSIS — R279 Unspecified lack of coordination: Secondary | ICD-10-CM | POA: Insufficient documentation

## 2024-01-28 DIAGNOSIS — M6281 Muscle weakness (generalized): Secondary | ICD-10-CM | POA: Insufficient documentation
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
## 2024-02-04 ENCOUNTER — Ambulatory Visit: Payer: 59 | Admitting: Physical Therapy

## 2024-02-04 DIAGNOSIS — M6281 Muscle weakness (generalized): Secondary | ICD-10-CM | POA: Diagnosis not present

## 2024-02-04 DIAGNOSIS — R279 Unspecified lack of coordination: Secondary | ICD-10-CM
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
## 2024-02-14 ENCOUNTER — Ambulatory Visit: Admitting: Physical Therapy

## 2024-02-14 DIAGNOSIS — M6281 Muscle weakness (generalized): Secondary | ICD-10-CM

## 2024-02-14 DIAGNOSIS — R279 Unspecified lack of coordination: Secondary | ICD-10-CM

## 2024-02-14 NOTE — Therapy (Signed)
 OUTPATIENT PHYSICAL THERAPY FEMALE PELVIC TREATMENT   Patient Name: Betty Mendoza MRN: [id number] DOB:[birth year], 43 y.o., female Today's Date: 02/14/2024  END OF SESSION:  PT End of Session - 02/14/24 1208     Visit Number 7    Authorization Type [employer]    PT Start Time 1015    PT Stop Time 1100    PT Time Calculation (min) 45 min    Activity Tolerance Patient tolerated treatment well    Behavior During Therapy WFL for tasks assessed/performed                  Past Medical History:  Diagnosis Date   Crohn disease (HCC)    No past surgical history on file. Patient Active Problem List   Diagnosis Date Noted   Palpitations 10/31/2016    PCP:  Lupita Raider, MD   REFERRING PROVIDER: Lupita Raider, MD  REFERRING DIAG: R32 (ICD-10-CM) - Unspecified urinary incontinence  THERAPY DIAG:  Muscle weakness (generalized)  Unspecified lack of coordination  Rationale for Evaluation and Treatment: Rehabilitation  ONSET DATE: 6-8 months ago   SUBJECTIVE:                                                                                                                                                                                           SUBJECTIVE STATEMENT: Patient reports that she is able to do 3x15 decline sit ups with weight and no leakage - urinary or bowels. She has not been able to do this in 2 years. Coughing is still present and when she is standing, she will still leak dime sized amount of urine in underwear, but she is able to control this leakage when seated. She has noticed that she was straining slightly after passing Bms. She is trying not to do this anymore.   Fluid intake: Yes: 2 gal/day      PAIN:  Are you having pain? No NPRS scale: 0/10  Pain location: N/A  PRECAUTIONS: None  RED FLAGS: None   WEIGHT BEARING RESTRICTIONS: No  FALLS:  Has patient fallen in last 6 months? 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   PLOF: Independent  PATIENT GOALS: to decrease the urinary leakage   PERTINENT HISTORY:  COLON SURGERY        SMALL INTESTINE SURGERY        TONSILLECTOMY        ABDOMINAL SURGERY        COLONOSCOPY        ILEOCOLECTOMY 06/27/2020    Chrons Disease  Last bowel resection  was in 2020 Sexual abuse: No  BOWEL MOVEMENT: Pain with bowel movement: Yes Type of bowel movement:Type (Bristol Stool Scale) BRISTOL STOOL SCALE type 7, Frequency 7-13x/day is normal, Strain No, and Splinting no Fully empty rectum: Yes:   Leakage: Yes: 1x/wk, large in amount  Pads: Yes: if she is squatting at the gym  Fiber supplement: Yes: metamucil daily, takes ammodium if over 15 bowel movements per day   URINATION: Pain with urination: No Fully empty bladder: No - dual collecting system  Stream: Strong Urgency: Yes: high urgency causes leakage  Frequency: 13-15/day Leakage: Urge to void Pads: Yes: only wears when at public or at the gym   INTERCOURSE: Pain with intercourse: During Penetration and Pain Interrupts Intercourse Ability to have vaginal penetration:  Yes:   Climax: yes Marinoff Scale: 2/3  PREGNANCY: Never been pregnant or had children   PROLAPSE: None Hx of prolapsed rectum in 2012  OBJECTIVE:  Note: Objective measures were completed at Evaluation unless otherwise noted.  COGNITION: Overall cognitive status: Within functional limits for tasks assessed     SENSATION: Light touch: Deficits left pelvic floor musculature decreased sensation due to previous surgical operations  Proprioception: Appears intact  MUSCLE LENGTH: WNL  FUNCTIONAL TESTS:  Squat: within normal limits   POSTURE: No Significant postural limitations  PELVIC ALIGNMENT: WNL  LUMBARAROM/PROM:  A/PROM A/PROM  eval  Flexion 25% limited  Extension 25% limited  Right lateral flexion   Left lateral flexion   Right rotation   Left rotation     (Blank rows = within normal limits)  LOWER EXTREMITY ROM: WNL  PALPATION:   General  increased muscle tone noted in bilateral adductors/hip flexors, no tenderness to palpation                 External Perineal Exam mild dryness noted externally, clitoral hood mobility within normal limits, no TTP with external palpation                              Internal Pelvic Floor: general overactivity noted throughout superficial and deep pelvic floor musculature, no pain with palpation throughout, pt tends to co-contract transverse abdominis and pelvic floor musculature   Patient confirms identification and approves PT to assess internal pelvic floor and treatment Yes No emotional/communication barriers or cognitive limitation. Patient is motivated to learn. Patient understands and agrees with treatment goals and plan. PT explains patient will be examined in standing, sitting, and lying down to see how their muscles and joints work. When they are ready, they will be asked to remove their underwear so PT can examine their perineum. The patient is also given the option of providing their own chaperone as one is not provided in our facility. The patient also has the right and is explained the right to defer or refuse any part of the evaluation or treatment including the internal exam. With the patient's consent, PT will use one gloved finger to gently assess the muscles of the pelvic floor, seeing how well it contracts and relaxes and if there is muscle symmetry. After, the patient will get dressed and PT and patient will discuss exam findings and plan of care. PT and patient discuss plan of care, schedule, attendance policy and HEP activities.  PELVIC MMT:   MMT eval  Vaginal 3/5, 10 quick flicks, 6 second hold  Internal Anal Sphincter 5/5  External Anal Sphincter 3/5  Puborectalis   Diastasis Recti None  found at above or below umbilicus  (Blank rows = not tested) TONE: General overactivity noted in  pelvic floor musculature - both superficial and deep. Rectally, patient is overactive and tends to hold her muscles in a steady state of contraction.  PROLAPSE: N/A  TODAY'S TREATMENT:                                                                                                                              DATE:  01/28/24: Neuromuscular re-education: Supine pelvic floor lengthening with diaphragmatic breathing to increase pelvic floor musculature ROM 2x10 Seated pelvic floor lengthening with diaphragmatic breathing to increase pelvic floor musculature ROM 2x10  Sidelying clamshell + reverse clamshell + diaphragmatic breathing 2x12 Manual therapy  Internal rectal muscle release techniques for relaxation - primarily external anal sphincter and puborectalis muscles  Pelvic floor lengthening and shortening with inhalation/exhalation for normal pelvic floor coordination and to assist with evacuation of bowel movements  Abdominal scar tissue mobilization/cupping technique to increase blood flow to scar to decrease scar tissue restriction Self care  pelvic floor active range of motion, exhalation during exertion to decrease pelvic floor strain during lifting  How to control fecal leakage and how to breathe when defecating for proper pelvic floor mechanics and lengthening  02/04/24: Neuromuscular re-education: Supine pelvic floor lengthening with diaphragmatic breathing to increase pelvic floor musculature ROM 2x10 Seated pelvic floor lengthening with diaphragmatic breathing to increase pelvic floor musculature ROM 2x10  Sidelying clamshell + reverse clamshell + diaphragmatic breathing 2x12 Manual therapy  Internal rectal muscle release techniques for relaxation - primarily external anal sphincter and puborectalis muscles  Pelvic floor lengthening and shortening with inhalation/exhalation for normal pelvic floor coordination and to assist with evacuation of bowel movements  Abdominal scar tissue  mobilization/cupping technique to increase blood flow to scar to decrease scar tissue restriction Self care  pelvic floor active range of motion, exhalation during exertion to decrease pelvic floor strain during lifting  How to control fecal leakage and how to breathe when defecating for proper pelvic floor mechanics and lengthening  02/14/24: Neuromuscular re-education: Hooklying diaphragmatic breathing + pelvic floor lengthening and shortening 2x10  Manual therapy  Internal rectal muscle release techniques for relaxation - primarily external anal sphincter and puborectalis muscles  Pelvic floor lengthening and shortening with inhalation/exhalation for normal pelvic floor coordination and to assist with evacuation of bowel movements  Trigger Point Dry Needling Initial Treatment: Pt instructed on Dry Needling rational, procedures, and possible side effects. Pt instructed to expect mild to moderate muscle soreness later in the day and/or into the next day.  Pt instructed in methods to reduce muscle soreness. Pt instructed to continue prescribed HEP. Because Dry Needling was performed over or adjacent to a lung field, pt was educated on S/S of pneumothorax and to seek immediate medical attention should they occur.  Patient was educated on signs and symptoms of infection and other risk factors and advised to seek medical attention  should they occur.  Patient verbalized understanding of these instructions and education.  Patient Verbal Consent Given: Yes Education Handout Provided: Yes Muscles Treated: bilateral lumbar multifidi, bilateral gluteus max, bilateral gluteus medius Electrical Stimulation Performed: No Treatment Response/Outcome: decreased trigger point palpation in gluteals, decreased lumbar tension with forward flexion Self care  pelvic floor active range of motion, exhalation during exertion to decrease pelvic floor strain during lifting  How to control fecal leakage and how to  breathe when defecating for proper pelvic floor mechanics and lengthening  PATIENT EDUCATION:  Education details: pelvic floor active range of motion, exhalation during exertion to decrease pelvic floor strain during lifting, vaginal moisturizers, knack technique, urge drill  Person educated: Patient Education method: Explanation, Demonstration, Tactile cues, Verbal cues, and Handouts Education comprehension: verbalized understanding, returned demonstration, verbal cues required, and tactile cues required  HOME EXERCISE PROGRAM: Access Code: [email or web address] URL: [email or web address] Date: 02/04/2024 Prepared by: Earna Coder  Exercises - Seated Quick Flick Pelvic Floor Contractions  - 1 x daily - 7 x weekly - 3 sets - 10 reps - Seated Exhale with Pelvic Floor Contraction and Hand to Mouth   - 1 x daily - 7 x weekly - 3 sets - 10 reps  ASSESSMENT:  CLINICAL IMPRESSION: Patient is a 43 y.o. female who was seen today for physical therapy treatment for urge urinary incontinence/fecal incontinence. Patient is continuing to demonstrate progress in physical therapy. She is now able to exercise without any urinary or fecal leakage. She is still leaking urine when she is standing and coughs, dime size amount of leakage only. She is still experiencing anal muscle spasms when defecating. Rectal internal work performed today to assess pelvic floor coordination and to decrease rectal tension from the past week. Rectally, patient was holding far less tension in her external anal sphincter compared to last week. She reports feeling a "golfball-like sensation" in the rectum before treatment, and after manual interventions, this sensation decreased and patient had no pain. Dry needling performed to lumbar musculature and gluteals to decrease pelvic muscle tension. Pt would benefit from additional PT to further address deficits.      OBJECTIVE IMPAIRMENTS: decreased coordination, decreased endurance,  decreased ROM, and decreased strength.   ACTIVITY LIMITATIONS: continence  PARTICIPATION LIMITATIONS:  none  PERSONAL FACTORS: Fitness and 1 comorbidity: Chrons disease  are also affecting patient's functional outcome.   REHAB POTENTIAL: Good  CLINICAL DECISION MAKING: Stable/uncomplicated  EVALUATION COMPLEXITY: Low   GOALS: Goals reviewed with patient? Yes  SHORT TERM GOALS: Target date: 01/21/2024  Pt will be independent with HEP.  Baseline: Goal status: GOAL MET 01/28/24  2.  Pt will be able to teach back and utilize urge suppression technique in order to help reduce number of trips to the bathroom.   Baseline:  Goal status: GOAL MET 01/28/24  3.  Pt will be independent with diaphragmatic breathing and down training activities in order to improve pelvic floor relaxation and range of motion. Baseline:  Goal status: GOAL MET 01/28/24  LONG TERM GOALS: Target date: 06/22/2024  Pt will be independent with advanced HEP.  Baseline:  Goal status: GOAL MET 02/14/24  2.  Pt will report decreased urinary frequency to 8-10 voids per day to improve general bladder habits and quality of life.  Baseline:  Goal status: GOAL MET 02/14/24  3.  Pt to demonstrate at least 4/5 pelvic floor strength for improved pelvic stability and decreased strain at pelvic floor/ decrease leakage.  Baseline: 3/5 Goal status: ONGOING  02/14/24  4. Pt to demonstrate improved coordination of pelvic floor and breathing mechanics with 10# squat with appropriate synergistic patterns to decrease pain and leakage at least 75% of the time.   Baseline:  Goal status: ONGOING 02/14/24  PLAN:  PT FREQUENCY: 1x/week  PT DURATION: 8 weeks  PLANNED INTERVENTIONS: 97110-Therapeutic exercises, 97530- Therapeutic activity, 97112- Neuromuscular re-education, 97535- Self Care, 56213- Manual therapy, [phone]- Aquatic Therapy, [phone]- Ultrasound, Patient/Family education, Taping, Dry Needling, Joint mobilization, Spinal  mobilization, Scar mobilization, Cryotherapy, and Moist heat  PLAN FOR NEXT SESSION: if no fecal/urinary incontinence, progress exercises. Continued manual interventions rectally/vaginally if experiencing pain or feelings of muscle tension  Earna Coder, PT, DPT 02/14/24 12:09 PM

## 2024-03-31 ENCOUNTER — Ambulatory Visit: Admitting: Physical Therapy

## 2024-04-07 ENCOUNTER — Ambulatory Visit: Attending: Family Medicine | Admitting: Physical Therapy

## 2024-04-07 DIAGNOSIS — R279 Unspecified lack of coordination: Secondary | ICD-10-CM | POA: Diagnosis present

## 2024-04-07 DIAGNOSIS — M6281 Muscle weakness (generalized): Secondary | ICD-10-CM | POA: Diagnosis present

## 2024-04-07 NOTE — Therapy (Signed)
 OUTPATIENT PHYSICAL THERAPY FEMALE PELVIC REASSESSMENT   Patient Name: Betty Mendoza MRN: [id number] DOB:May 11, 1981, 43 y.o., female Today's Date: 04/07/2024  END OF SESSION:  PT End of Session - 04/07/24 1617     Visit Number 8    Date for PT Re-Evaluation 10/08/24    Authorization Type [employer]    PT Start Time 0330    PT Stop Time 0415    PT Time Calculation (min) 45 min    Activity Tolerance Patient tolerated treatment well    Behavior During Therapy WFL for tasks assessed/performed                   Past Medical History:  Diagnosis Date   Crohn disease (HCC)    No past surgical history on file. Patient Active Problem List   Diagnosis Date Noted   Palpitations 10/31/2016    PCP:  Glena Landau, MD   REFERRING PROVIDER: Glena Landau, MD  REFERRING DIAG: R32 (ICD-10-CM) - Unspecified urinary incontinence  THERAPY DIAG:  Muscle weakness (generalized)  Unspecified lack of coordination  Rationale for Evaluation and Treatment: Rehabilitation  ONSET DATE: 6-8 months ago   SUBJECTIVE:                                                                                                                                                                                           SUBJECTIVE STATEMENT: Patient reports back to PT after a brief hiatus in treatment. She has been able to do her decline sit ups with a 25 lb plate with NO instances of urinary or fecal leakage. She has had "maybe" two instances of leakage since last session when she wasn't prepared for exercise impact. Intercourse has not been painful recently. Her show is in August for bodybuilding. She still feels like she is sitting on a ball in her rectum at home after she has a bowel movement, which she thinks is due to a rectal spasm. This happens after bowel movements consistently.  Fluid intake: Yes: 2 gal/day     PAIN:  Are you having pain? No NPRS scale: 0/10  Pain location:  N/A  PRECAUTIONS: None  RED FLAGS: None   WEIGHT BEARING RESTRICTIONS: No  FALLS:  Has patient fallen in last 6 months? 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   PLOF: Independent  PATIENT GOALS: to decrease the urinary leakage   PERTINENT HISTORY:  COLON SURGERY        SMALL INTESTINE SURGERY        TONSILLECTOMY        ABDOMINAL  SURGERY        COLONOSCOPY        ILEOCOLECTOMY 06/27/2020    Chrons Disease  Last bowel resection was in 2020 Sexual abuse: No  BOWEL MOVEMENT: Pain with bowel movement: Yes Type of bowel movement:Type (Bristol Stool Scale) BRISTOL STOOL SCALE type 7, Frequency 7-13x/day is normal, Strain No, and Splinting no Fully empty rectum: Yes:   Leakage: Yes: 1x/wk, large in amount  Pads: Yes: if she is squatting at the gym  Fiber supplement: Yes: metamucil daily, takes ammodium if over 15 bowel movements per day   URINATION: Pain with urination: No Fully empty bladder: No - dual collecting system  Stream: Strong Urgency: Yes: high urgency causes leakage  Frequency: 13-15/day Leakage: Urge to void Pads: Yes: only wears when at public or at the gym   INTERCOURSE: Pain with intercourse: During Penetration and Pain Interrupts Intercourse Ability to have vaginal penetration:  Yes:   Climax: yes Marinoff Scale: 2/3  PREGNANCY: Never been pregnant or had children   PROLAPSE: None Hx of prolapsed rectum in 2012  OBJECTIVE:  Note: Objective measures were completed at Evaluation unless otherwise noted.  COGNITION: Overall cognitive status: Within functional limits for tasks assessed     SENSATION: Light touch: Deficits left pelvic floor musculature decreased sensation due to previous surgical operations  Proprioception: Appears intact  MUSCLE LENGTH: WNL  FUNCTIONAL TESTS:  Squat: within normal limits   POSTURE: No Significant postural limitations  PELVIC ALIGNMENT:  WNL  LUMBARAROM/PROM:  A/PROM A/PROM  eval  Flexion 25% limited  Extension 25% limited  Right lateral flexion   Left lateral flexion   Right rotation   Left rotation    (Blank rows = within normal limits)  LOWER EXTREMITY ROM: WNL  PALPATION:   General  increased muscle tone noted in bilateral adductors/hip flexors, no tenderness to palpation                 External Perineal Exam mild dryness noted externally, clitoral hood mobility within normal limits, no TTP with external palpation                              Internal Pelvic Floor: general overactivity noted throughout superficial and deep pelvic floor musculature, no pain with palpation throughout, pt tends to co-contract transverse abdominis and pelvic floor musculature   Patient confirms identification and approves PT to assess internal pelvic floor and treatment Yes No emotional/communication barriers or cognitive limitation. Patient is motivated to learn. Patient understands and agrees with treatment goals and plan. PT explains patient will be examined in standing, sitting, and lying down to see how their muscles and joints work. When they are ready, they will be asked to remove their underwear so PT can examine their perineum. The patient is also given the option of providing their own chaperone as one is not provided in our facility. The patient also has the right and is explained the right to defer or refuse any part of the evaluation or treatment including the internal exam. With the patient's consent, PT will use one gloved finger to gently assess the muscles of the pelvic floor, seeing how well it contracts and relaxes and if there is muscle symmetry. After, the patient will get dressed and PT and patient will discuss exam findings and plan of care. PT and patient discuss plan of care, schedule, attendance policy and HEP activities.  PELVIC MMT:   MMT  eval Reassessment 04/07/24  Vaginal 3/5, 10 quick flicks, 6 second hold    Internal Anal Sphincter 5/5 5/5  External Anal Sphincter 3/5 4/5  Puborectalis    Diastasis Recti None found at above or below umbilicus   (Blank rows = not tested) TONE: General overactivity noted in pelvic floor musculature - both superficial and deep. Rectally, patient is overactive and tends to hold her muscles in a steady state of contraction.  Reassessment 04/07/24: external anal sphincter tone was very high upon assessment of rectal musculature today. This tone decreased with deep diaphragmatic breathing and pelvic floor lengthening.  PROLAPSE: N/A  TODAY'S TREATMENT:                                                                                                                              DATE:  02/04/24: Neuromuscular re-education: Supine pelvic floor lengthening with diaphragmatic breathing to increase pelvic floor musculature ROM 2x10 Seated pelvic floor lengthening with diaphragmatic breathing to increase pelvic floor musculature ROM 2x10  Sidelying clamshell + reverse clamshell + diaphragmatic breathing 2x12 Manual therapy  Internal rectal muscle release techniques for relaxation - primarily external anal sphincter and puborectalis muscles  Pelvic floor lengthening and shortening with inhalation/exhalation for normal pelvic floor coordination and to assist with evacuation of bowel movements  Abdominal scar tissue mobilization/cupping technique to increase blood flow to scar to decrease scar tissue restriction Self care  pelvic floor active range of motion, exhalation during exertion to decrease pelvic floor strain during lifting  How to control fecal leakage and how to breathe when defecating for proper pelvic floor mechanics and lengthening  02/14/24: Neuromuscular re-education: Hooklying diaphragmatic breathing + pelvic floor lengthening and shortening 2x10  Manual therapy  Internal rectal muscle release techniques for relaxation - primarily external anal sphincter and  puborectalis muscles  Pelvic floor lengthening and shortening with inhalation/exhalation for normal pelvic floor coordination and to assist with evacuation of bowel movements  Trigger Point Dry Needling Initial Treatment: Pt instructed on Dry Needling rational, procedures, and possible side effects. Pt instructed to expect mild to moderate muscle soreness later in the day and/or into the next day.  Pt instructed in methods to reduce muscle soreness. Pt instructed to continue prescribed HEP. Because Dry Needling was performed over or adjacent to a lung field, pt was educated on S/S of pneumothorax and to seek immediate medical attention should they occur.  Patient was educated on signs and symptoms of infection and other risk factors and advised to seek medical attention should they occur.  Patient verbalized understanding of these instructions and education.  Patient Verbal Consent Given: Yes Education Handout Provided: Yes Muscles Treated: bilateral lumbar multifidi, bilateral gluteus max, bilateral gluteus medius Electrical Stimulation Performed: No Treatment Response/Outcome: decreased trigger point palpation in gluteals, decreased lumbar tension with forward flexion Self care  pelvic floor active range of motion, exhalation during exertion to decrease pelvic floor strain during lifting  How to control fecal  leakage and how to breathe when defecating for proper pelvic floor mechanics and lengthening  04/07/24:  Neuromuscular re-education: Hooklying diaphragmatic breathing + pelvic floor lengthening and shortening 2x10  Manual therapy  Internal rectal muscle release techniques for relaxation - primarily external anal sphincter and puborectalis muscles  Pelvic floor lengthening and shortening with inhalation/exhalation for normal pelvic floor coordination and to assist with evacuation of bowel movements  Self care  pelvic floor active range of motion, exhalation during exertion to decrease  pelvic floor strain during lifting  How to control fecal leakage and how to breathe when defecating for proper pelvic floor mechanics and lengthening Discussion of pelvic floor wand for managing rectal pressure and tension following bowel movements   PATIENT EDUCATION:  Education details: pelvic floor active range of motion, exhalation during exertion to decrease pelvic floor strain during lifting, vaginal moisturizers, knack technique, urge drill  Person educated: Patient Education method: Explanation, Demonstration, Tactile cues, Verbal cues, and Handouts Education comprehension: verbalized understanding, returned demonstration, verbal cues required, and tactile cues required  HOME EXERCISE PROGRAM: Access Code: [email or web address] URL: [email or web address] Date: 02/04/2024 Prepared by: Robbin Chill  Exercises - Seated Quick Flick Pelvic Floor Contractions  - 1 x daily - 7 x weekly - 3 sets - 10 reps - Seated Exhale with Pelvic Floor Contraction and Hand to Mouth   - 1 x daily - 7 x weekly - 3 sets - 10 reps  ASSESSMENT:  CLINICAL IMPRESSION: Patient is a 43 y.o. female who was seen today for physical therapy reassessment for urge urinary incontinence/fecal incontinence. Patient is continuing to demonstrate progress in physical therapy. She is now able to ALL forms of exercise without any urinary or fecal leakage. Her limiting factor for being done with pelvic PT is that she is still experiencing rectal tension after bowel movements, described as a "painful spasm". Rectal internal work performed today to assess pelvic floor coordination and to decrease rectal tension from the past week. Rectally, patient was holding significant tension in her external anal sphincter and internal anal sphincter. Patient's pelvic floor responded well to treatment other than with movement of PT's finger, which resulted in puborectalis spasms. Patient needs consistent rectal muscle release work on a regular  basis to ensure that her puborectalis muscle doesn't stay in a state of spasm, so we discussed the use of a pelvic wand and pt plans to purchase one of these. Pt would benefit from additional PT to further address deficits.      OBJECTIVE IMPAIRMENTS: decreased coordination, decreased endurance, decreased ROM, and decreased strength.   ACTIVITY LIMITATIONS: continence  PARTICIPATION LIMITATIONS: none  PERSONAL FACTORS: Fitness and 1 comorbidity: Chrons disease are also affecting patient's functional outcome.   REHAB POTENTIAL: Good  CLINICAL DECISION MAKING: Stable/uncomplicated  EVALUATION COMPLEXITY: Low   GOALS: Goals reviewed with patient? Yes  SHORT TERM GOALS: Target date: 01/21/2024  Pt will be independent with HEP.  Baseline: Goal status: GOAL MET 01/28/24  2.  Pt will be able to teach back and utilize urge suppression technique in order to help reduce number of trips to the bathroom.   Baseline:  Goal status: GOAL MET 01/28/24  3.  Pt will be independent with diaphragmatic breathing and down training activities in order to improve pelvic floor relaxation and range of motion. Baseline:  Goal status: GOAL MET 01/28/24  LONG TERM GOALS: Target date: 06/22/2024  Pt will be independent with advanced HEP.  Baseline:  Goal status: GOAL MET 02/14/24  2.  Pt  will report decreased urinary frequency to 8-10 voids per day to improve general bladder habits and quality of life.  Baseline:  Goal status: GOAL MET 02/14/24  3.  Pt to demonstrate at least 4/5 pelvic floor strength for improved pelvic stability and decreased strain at pelvic floor/ decrease leakage.  Baseline: 5/5 Goal status: GOAL MET 04/07/24  4. Pt to demonstrate improved coordination of pelvic floor and breathing mechanics with 10# squat with appropriate synergistic patterns to decrease pain and leakage at least 75% of the time.   Baseline:  Goal status: GOAL MET 04/07/24  5. Patient will report independent use of  pelvic floor wand to release rectal tension following bowel movements to decrease rectal pain at rest and following bowel movements to improve quality of life.  Goal status: ONGOING 04/07/24  PLAN:  PT FREQUENCY: 1x/week  PT DURATION: 8 weeks  PLANNED INTERVENTIONS: 97110-Therapeutic exercises, 97530- Therapeutic activity, 97112- Neuromuscular re-education, 97535- Self Care, 86578- Manual therapy, [phone]- Aquatic Therapy, [phone]- Ultrasound, Patient/Family education, Taping, Dry Needling, Joint mobilization, Spinal mobilization, Scar mobilization, Cryotherapy, and Moist heat  PLAN FOR NEXT SESSION: CONTINUED INTERNAL TREATMENT AND [facility] EDUCATION FOR INDEPENDENT USE  Robbin Chill, PT, DPT 04/07/24 4:25 PM

## 2024-04-14 ENCOUNTER — Ambulatory Visit: Admitting: Physical Therapy

## 2024-04-15 ENCOUNTER — Ambulatory Visit: Admitting: Physical Therapy

## 2024-04-15 DIAGNOSIS — M6281 Muscle weakness (generalized): Secondary | ICD-10-CM

## 2024-04-15 DIAGNOSIS — R279 Unspecified lack of coordination: Secondary | ICD-10-CM

## 2024-04-15 NOTE — Therapy (Signed)
 OUTPATIENT PHYSICAL THERAPY FEMALE PELVIC TREATMENT   Patient Name: Betty Mendoza MRN: [id number] DOB:1981/07/08, 43 y.o., female Today's Date: 04/15/2024  END OF SESSION:  PT End of Session - 04/15/24 1010     Visit Number 9    Date for PT Re-Evaluation 10/08/24    Authorization Type [employer]    PT Start Time 0930    PT Stop Time 1010    PT Time Calculation (min) 40 min    Activity Tolerance Patient tolerated treatment well    Behavior During Therapy WFL for tasks assessed/performed                    Past Medical History:  Diagnosis Date   Crohn disease (HCC)    No past surgical history on file. Patient Active Problem List   Diagnosis Date Noted   Palpitations 10/31/2016    PCP:  Glena Landau, MD   REFERRING PROVIDER: Glena Landau, MD  REFERRING DIAG: R32 (ICD-10-CM) - Unspecified urinary incontinence  THERAPY DIAG:  Muscle weakness (generalized)  Unspecified lack of coordination  Rationale for Evaluation and Treatment: Rehabilitation  ONSET DATE: 6-8 months ago   SUBJECTIVE:                                                                                                                                                                                           SUBJECTIVE STATEMENT: Patient reports that she had a new leg day split today and it went well. Intercourse has been good recently. She has had one bladder accident this week and it was when her urgency was high and she waited too long to go to the bathroom. She felt good for two days after last visit, but she can tell her rectal canal is spasming again. She brought her wand with her to the clinic today. No instances of fecal incontinence since last visit.   Fluid intake: Yes: 2 gal/day     PAIN:  Are you having pain? No NPRS scale: 0/10  Pain location: N/A  PRECAUTIONS: None  RED FLAGS: None   WEIGHT BEARING RESTRICTIONS: No  FALLS:  Has patient fallen in last 6  months? 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   PLOF: Independent  PATIENT GOALS: to decrease the urinary leakage   PERTINENT HISTORY:  COLON SURGERY        SMALL INTESTINE SURGERY        TONSILLECTOMY        ABDOMINAL SURGERY        COLONOSCOPY  ILEOCOLECTOMY 06/27/2020    Chrons Disease  Last bowel resection was in 2020 Sexual abuse: No  BOWEL MOVEMENT: Pain with bowel movement: Yes Type of bowel movement:Type (Bristol Stool Scale) BRISTOL STOOL SCALE type 7, Frequency 7-13x/day is normal, Strain No, and Splinting no Fully empty rectum: Yes:   Leakage: Yes: 1x/wk, large in amount  Pads: Yes: if she is squatting at the gym  Fiber supplement: Yes: metamucil daily, takes ammodium if over 15 bowel movements per day   URINATION: Pain with urination: No Fully empty bladder: No - dual collecting system  Stream: Strong Urgency: Yes: high urgency causes leakage  Frequency: 13-15/day Leakage: Urge to void Pads: Yes: only wears when at public or at the gym   INTERCOURSE: Pain with intercourse: During Penetration and Pain Interrupts Intercourse Ability to have vaginal penetration:  Yes:   Climax: yes Marinoff Scale: 2/3  PREGNANCY: Never been pregnant or had children   PROLAPSE: None Hx of prolapsed rectum in 2012  OBJECTIVE:  Note: Objective measures were completed at Evaluation unless otherwise noted.  COGNITION: Overall cognitive status: Within functional limits for tasks assessed     SENSATION: Light touch: Deficits left pelvic floor musculature decreased sensation due to previous surgical operations  Proprioception: Appears intact  MUSCLE LENGTH: WNL  FUNCTIONAL TESTS:  Squat: within normal limits   POSTURE: No Significant postural limitations  PELVIC ALIGNMENT: WNL  LUMBARAROM/PROM:  A/PROM A/PROM  eval  Flexion 25% limited  Extension 25% limited  Right lateral flexion   Left  lateral flexion   Right rotation   Left rotation    (Blank rows = within normal limits)  LOWER EXTREMITY ROM: WNL  PALPATION:   General  increased muscle tone noted in bilateral adductors/hip flexors, no tenderness to palpation                 External Perineal Exam mild dryness noted externally, clitoral hood mobility within normal limits, no TTP with external palpation                              Internal Pelvic Floor: general overactivity noted throughout superficial and deep pelvic floor musculature, no pain with palpation throughout, pt tends to co-contract transverse abdominis and pelvic floor musculature   Patient confirms identification and approves PT to assess internal pelvic floor and treatment Yes No emotional/communication barriers or cognitive limitation. Patient is motivated to learn. Patient understands and agrees with treatment goals and plan. PT explains patient will be examined in standing, sitting, and lying down to see how their muscles and joints work. When they are ready, they will be asked to remove their underwear so PT can examine their perineum. The patient is also given the option of providing their own chaperone as one is not provided in our facility. The patient also has the right and is explained the right to defer or refuse any part of the evaluation or treatment including the internal exam. With the patient's consent, PT will use one gloved finger to gently assess the muscles of the pelvic floor, seeing how well it contracts and relaxes and if there is muscle symmetry. After, the patient will get dressed and PT and patient will discuss exam findings and plan of care. PT and patient discuss plan of care, schedule, attendance policy and HEP activities.  PELVIC MMT:   MMT eval Reassessment 04/07/24  Vaginal 3/5, 10 quick flicks, 6 second hold   Internal Anal  Sphincter 5/5 5/5  External Anal Sphincter 3/5 4/5  Puborectalis    Diastasis Recti None found at above or  below umbilicus   (Blank rows = not tested) TONE: General overactivity noted in pelvic floor musculature - both superficial and deep. Rectally, patient is overactive and tends to hold her muscles in a steady state of contraction.  Reassessment 04/07/24: external anal sphincter tone was very high upon assessment of rectal musculature today. This tone decreased with deep diaphragmatic breathing and pelvic floor lengthening.  PROLAPSE: N/A  TODAY'S TREATMENT:                                                                                                                              DATE:  02/14/24: Neuromuscular re-education: Hooklying diaphragmatic breathing + pelvic floor lengthening and shortening 2x10  Manual therapy  Internal rectal muscle release techniques for relaxation - primarily external anal sphincter and puborectalis muscles  Pelvic floor lengthening and shortening with inhalation/exhalation for normal pelvic floor coordination and to assist with evacuation of bowel movements  Trigger Point Dry Needling Initial Treatment: Pt instructed on Dry Needling rational, procedures, and possible side effects. Pt instructed to expect mild to moderate muscle soreness later in the day and/or into the next day.  Pt instructed in methods to reduce muscle soreness. Pt instructed to continue prescribed HEP. Because Dry Needling was performed over or adjacent to a lung field, pt was educated on S/S of pneumothorax and to seek immediate medical attention should they occur.  Patient was educated on signs and symptoms of infection and other risk factors and advised to seek medical attention should they occur.  Patient verbalized understanding of these instructions and education.  Patient Verbal Consent Given: Yes Education Handout Provided: Yes Muscles Treated: bilateral lumbar multifidi, bilateral gluteus max, bilateral gluteus medius Electrical Stimulation Performed: No Treatment Response/Outcome:  decreased trigger point palpation in gluteals, decreased lumbar tension with forward flexion Self care  pelvic floor active range of motion, exhalation during exertion to decrease pelvic floor strain during lifting  How to control fecal leakage and how to breathe when defecating for proper pelvic floor mechanics and lengthening  04/07/24:  Neuromuscular re-education: Hooklying diaphragmatic breathing + pelvic floor lengthening and shortening 2x10  Manual therapy  Internal rectal muscle release techniques for relaxation - primarily external anal sphincter and puborectalis muscles  Pelvic floor lengthening and shortening with inhalation/exhalation for normal pelvic floor coordination and to assist with evacuation of bowel movements  Self care  pelvic floor active range of motion, exhalation during exertion to decrease pelvic floor strain during lifting  How to control fecal leakage and how to breathe when defecating for proper pelvic floor mechanics and lengthening Discussion of pelvic floor wand for managing rectal pressure and tension following bowel movements   04/15/24:  Neuromuscular re-education: sidelying diaphragmatic breathing + pelvic floor lengthening for relaxation of rectal canal 2x10  Manual therapy  Internal rectal muscle release techniques for relaxation -  primarily external anal sphincter and puborectalis muscles  Pelvic floor lengthening and shortening with inhalation/exhalation for normal pelvic floor coordination and to assist with evacuation of bowel movements  Anal wand education with use of wand for patient training at home for independent use  Self care  pelvic floor active range of motion, exhalation during exertion to decrease pelvic floor strain during lifting  How to control fecal leakage and how to breathe when defecating for proper pelvic floor mechanics and lengthening Discussion of pelvic floor wand for managing rectal pressure and tension following bowel  movements Anal wand education with use of wand for patient training at home for independent use    PATIENT EDUCATION:  Education details: pelvic floor active range of motion, exhalation during exertion to decrease pelvic floor strain during lifting, vaginal moisturizers, knack technique, urge drill  Person educated: Patient Education method: Explanation, Demonstration, Tactile cues, Verbal cues, and Handouts Education comprehension: verbalized understanding, returned demonstration, verbal cues required, and tactile cues required  HOME EXERCISE PROGRAM: Access Code: [email or web address] URL: [email or web address] Date: 02/04/2024 Prepared by: Robbin Chill  Exercises - Seated Quick Flick Pelvic Floor Contractions  - 1 x daily - 7 x weekly - 3 sets - 10 reps - Seated Exhale with Pelvic Floor Contraction and Hand to Mouth   - 1 x daily - 7 x weekly - 3 sets - 10 reps  ASSESSMENT:  CLINICAL IMPRESSION: Patient is a 43 y.o. female who was seen today for physical therapy treatment for urge urinary incontinence/fecal incontinence. Patient is in 0/10 pain in the pelvis today and she has had no instances of fecal incontinence within the past week. She did have a random bout of urge incontinence this week but it was very small in nature and it happened because she waited too long to go to the bathroom. Internal rectal treatment provided today to decrease muscle tension in the rectum. Patient had significant muscle tension present that decreased with rectal soft tissue mobilization and deep diaphragmatic breathing. Patient needs consistent rectal muscle release work on a regular basis to ensure that her puborectalis muscle doesn't stay in a state of spasm, so we discussed the routine use of a pelvic wand and pt plans to start using her wand at home. Pt would benefit from additional PT to further address deficits.      OBJECTIVE IMPAIRMENTS: decreased coordination, decreased endurance, decreased ROM, and  decreased strength.   ACTIVITY LIMITATIONS: continence  PARTICIPATION LIMITATIONS: none  PERSONAL FACTORS: Fitness and 1 comorbidity: Chrons disease are also affecting patient's functional outcome.   REHAB POTENTIAL: Good  CLINICAL DECISION MAKING: Stable/uncomplicated  EVALUATION COMPLEXITY: Low   GOALS: Goals reviewed with patient? Yes  SHORT TERM GOALS: Target date: 01/21/2024  Pt will be independent with HEP.  Baseline: Goal status: GOAL MET 01/28/24  2.  Pt will be able to teach back and utilize urge suppression technique in order to help reduce number of trips to the bathroom.   Baseline:  Goal status: GOAL MET 01/28/24  3.  Pt will be independent with diaphragmatic breathing and down training activities in order to improve pelvic floor relaxation and range of motion. Baseline:  Goal status: GOAL MET 01/28/24  LONG TERM GOALS: Target date: 06/22/2024  Pt will be independent with advanced HEP.  Baseline:  Goal status: GOAL MET 02/14/24  2.  Pt will report decreased urinary frequency to 8-10 voids per day to improve general bladder habits and quality of life.  Baseline:  Goal status: GOAL MET  02/14/24  3.  Pt to demonstrate at least 4/5 pelvic floor strength for improved pelvic stability and decreased strain at pelvic floor/ decrease leakage.  Baseline: 5/5 Goal status: GOAL MET 04/07/24  4. Pt to demonstrate improved coordination of pelvic floor and breathing mechanics with 10# squat with appropriate synergistic patterns to decrease pain and leakage at least 75% of the time.   Baseline:  Goal status: GOAL MET 04/07/24  5. Patient will report independent use of pelvic floor wand to release rectal tension following bowel movements to decrease rectal pain at rest and following bowel movements to improve quality of life.  Goal status: ONGOING 04/07/24  PLAN:  PT FREQUENCY: 1x/week  PT DURATION: 8 weeks  PLANNED INTERVENTIONS: 97110-Therapeutic exercises, 97530-  Therapeutic activity, 97112- Neuromuscular re-education, 97535- Self Care, 11914- Manual therapy, [phone]- Aquatic Therapy, [phone]- Ultrasound, Patient/Family education, Taping, Dry Needling, Joint mobilization, Spinal mobilization, Scar mobilization, Cryotherapy, and Moist heat  PLAN FOR NEXT SESSION: CONTINUED INTERNAL TREATMENT AND WAND EDUCATION FOR INDEPENDENT USE  Robbin Chill, PT, DPT 04/15/24 10:11 AM

## 2024-04-15 NOTE — Patient Instructions (Signed)
 When you feel the golfball sensation, use your rectal wand:  Water based lube: use LOTS (place it on yourself and on the wand) Make sure to use a gentle soap on the wand before use and after use  Positioning: sidelying with a pillow between your knees, reaching around behind you with your right hand  Process: Start by taking 3 deep belly breaths, directing the air into your rectum/pelvis  Place the tip of the wand at the anal sphincter, and take a big breath in as you gently press it in If you cannot get the wand in with inhales, try gently bearing down like you are pooping to insert the tip Continue these breaths as you gently glide the wand in  Once the wand is in, you can do the following: Either sit with the wand steady as you belly breathe for 2x10 breaths  OR Move the wand around gently as you breathe, focusing on points of tenderness  Do this max for 5-8 minutes at a time  When you are done, take a big inhale as you gently slide the wand out

## 2024-04-21 ENCOUNTER — Ambulatory Visit: Admitting: Physical Therapy

## 2024-04-28 ENCOUNTER — Ambulatory Visit: Attending: Family Medicine | Admitting: Physical Therapy

## 2024-04-28 DIAGNOSIS — M6281 Muscle weakness (generalized): Secondary | ICD-10-CM | POA: Diagnosis present

## 2024-04-28 NOTE — Therapy (Signed)
 OUTPATIENT PHYSICAL THERAPY FEMALE PELVIC TREATMENT   Patient Name: Betty Mendoza MRN: [id number] DOB:1981/09/24, 43 y.o., female Today's Date: 04/28/2024  END OF SESSION:  PT End of Session - 04/28/24 1612     Visit Number 10    Date for PT Re-Evaluation 10/08/24    Authorization Type [employer]    PT Start Time 0330    PT Stop Time 0415    PT Time Calculation (min) 45 min    Activity Tolerance Patient tolerated treatment well    Behavior During Therapy WFL for tasks assessed/performed                     Past Medical History:  Diagnosis Date   Crohn disease (HCC)    No past surgical history on file. Patient Active Problem List   Diagnosis Date Noted   Palpitations 10/31/2016    PCP:  Glena Landau, MD   REFERRING PROVIDER: Glena Landau, MD  REFERRING DIAG: R32 (ICD-10-CM) - Unspecified urinary incontinence  THERAPY DIAG:  Muscle weakness (generalized)  Rationale for Evaluation and Treatment: Rehabilitation  ONSET DATE: 6-8 months ago   SUBJECTIVE:                                                                                                                                                                                           SUBJECTIVE STATEMENT: Patient reports that she is having a chrons flare up due to stress in her life from switching jobs. She can tell that her anal musculature is very tight and tense in response to this. She has not had a chance to use her wand at home.  Patient reports that she had a new leg day split today and it went well. Intercourse has been good recently. She has had one bladder accident this week and it was when her urgency was high and she waited too long to go to the bathroom. She felt good for two days after last visit, but she can tell her rectal canal is spasming again. She brought her wand with her to the clinic today. No instances of fecal incontinence since last visit.   Fluid intake: Yes: 2  gal/day     PAIN:  Are you having pain? No NPRS scale: 0/10  Pain location: N/A  PRECAUTIONS: None  RED FLAGS: None   WEIGHT BEARING RESTRICTIONS: No  FALLS:  Has patient fallen in last 6 months? 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   PLOF: Independent  PATIENT GOALS: to decrease the urinary leakage   PERTINENT HISTORY:  COLON SURGERY        SMALL INTESTINE SURGERY        TONSILLECTOMY        ABDOMINAL SURGERY        COLONOSCOPY        ILEOCOLECTOMY 06/27/2020    Chrons Disease  Last bowel resection was in 2020 Sexual abuse: No  BOWEL MOVEMENT: Pain with bowel movement: Yes Type of bowel movement:Type (Bristol Stool Scale) BRISTOL STOOL SCALE type 7, Frequency 7-13x/day is normal, Strain No, and Splinting no Fully empty rectum: Yes:   Leakage: Yes: 1x/wk, large in amount  Pads: Yes: if she is squatting at the gym  Fiber supplement: Yes: metamucil daily, takes ammodium if over 15 bowel movements per day   URINATION: Pain with urination: No Fully empty bladder: No - dual collecting system  Stream: Strong Urgency: Yes: high urgency causes leakage  Frequency: 13-15/day Leakage: Urge to void Pads: Yes: only wears when at public or at the gym   INTERCOURSE: Pain with intercourse: During Penetration and Pain Interrupts Intercourse Ability to have vaginal penetration:  Yes:   Climax: yes Marinoff Scale: 2/3  PREGNANCY: Never been pregnant or had children   PROLAPSE: None Hx of prolapsed rectum in 2012  OBJECTIVE:  Note: Objective measures were completed at Evaluation unless otherwise noted.  COGNITION: Overall cognitive status: Within functional limits for tasks assessed     SENSATION: Light touch: Deficits left pelvic floor musculature decreased sensation due to previous surgical operations  Proprioception: Appears intact  MUSCLE LENGTH: WNL  FUNCTIONAL TESTS:  Squat: within normal  limits   POSTURE: No Significant postural limitations  PELVIC ALIGNMENT: WNL  LUMBARAROM/PROM:  A/PROM A/PROM  eval  Flexion 25% limited  Extension 25% limited  Right lateral flexion   Left lateral flexion   Right rotation   Left rotation    (Blank rows = within normal limits)  LOWER EXTREMITY ROM: WNL  PALPATION:   General  increased muscle tone noted in bilateral adductors/hip flexors, no tenderness to palpation                 External Perineal Exam mild dryness noted externally, clitoral hood mobility within normal limits, no TTP with external palpation                              Internal Pelvic Floor: general overactivity noted throughout superficial and deep pelvic floor musculature, no pain with palpation throughout, pt tends to co-contract transverse abdominis and pelvic floor musculature   Patient confirms identification and approves PT to assess internal pelvic floor and treatment Yes No emotional/communication barriers or cognitive limitation. Patient is motivated to learn. Patient understands and agrees with treatment goals and plan. PT explains patient will be examined in standing, sitting, and lying down to see how their muscles and joints work. When they are ready, they will be asked to remove their underwear so PT can examine their perineum. The patient is also given the option of providing their own chaperone as one is not provided in our facility. The patient also has the right and is explained the right to defer or refuse any part of the evaluation or treatment including the internal exam. With the patient's consent, PT will use one gloved finger to gently assess the muscles of the pelvic floor, seeing how well it contracts and relaxes and if there is muscle symmetry. After, the patient will get dressed and PT and  patient will discuss exam findings and plan of care. PT and patient discuss plan of care, schedule, attendance policy and HEP activities.  PELVIC MMT:    MMT eval Reassessment 04/07/24  Vaginal 3/5, 10 quick flicks, 6 second hold   Internal Anal Sphincter 5/5 5/5  External Anal Sphincter 3/5 4/5  Puborectalis    Diastasis Recti None found at above or below umbilicus   (Blank rows = not tested) TONE: General overactivity noted in pelvic floor musculature - both superficial and deep. Rectally, patient is overactive and tends to hold her muscles in a steady state of contraction.  Reassessment 04/07/24: external anal sphincter tone was very high upon assessment of rectal musculature today. This tone decreased with deep diaphragmatic breathing and pelvic floor lengthening.  PROLAPSE: N/A  TODAY'S TREATMENT:                                                                                                                              DATE:  04/07/24:  Neuromuscular re-education: Hooklying diaphragmatic breathing + pelvic floor lengthening and shortening 2x10  Manual therapy  Internal rectal muscle release techniques for relaxation - primarily external anal sphincter and puborectalis muscles  Pelvic floor lengthening and shortening with inhalation/exhalation for normal pelvic floor coordination and to assist with evacuation of bowel movements  Self care  pelvic floor active range of motion, exhalation during exertion to decrease pelvic floor strain during lifting  How to control fecal leakage and how to breathe when defecating for proper pelvic floor mechanics and lengthening Discussion of pelvic floor wand for managing rectal pressure and tension following bowel movements   04/15/24:  Neuromuscular re-education: sidelying diaphragmatic breathing + pelvic floor lengthening for relaxation of rectal canal 2x10  Manual therapy  Internal rectal muscle release techniques for relaxation - primarily external anal sphincter and puborectalis muscles  Pelvic floor lengthening and shortening with inhalation/exhalation for normal pelvic floor coordination  and to assist with evacuation of bowel movements  Anal wand education with use of wand for patient training at home for independent use  Self care  pelvic floor active range of motion, exhalation during exertion to decrease pelvic floor strain during lifting  How to control fecal leakage and how to breathe when defecating for proper pelvic floor mechanics and lengthening Discussion of pelvic floor wand for managing rectal pressure and tension following bowel movements Anal wand education with use of wand for patient training at home for independent use    04/28/24:  Neuromuscular re-education: sidelying diaphragmatic breathing + pelvic floor lengthening for relaxation of rectal canal 2x10  Manual therapy  Internal rectal muscle release techniques for relaxation - primarily external anal sphincter and puborectalis muscles bilaterally Sustained pressure release on bilateral puborectalis muscles: inhale and pin, exhale and stretch 2x3 each side of pelvis  Pelvic floor lengthening and shortening with inhalation/exhalation for normal pelvic floor coordination and to assist with evacuation of bowel movements  Anal wand education with use  of wand for patient training at home for independent use  Self care  pelvic floor active range of motion, exhalation during exertion to decrease pelvic floor strain during lifting  How to control fecal leakage and how to breathe when defecating for proper pelvic floor mechanics and lengthening Discussion of pelvic floor wand for managing rectal pressure and tension following bowel movements Anal wand education with use of wand for patient training at home for independent use    PATIENT EDUCATION:  Education details: pelvic floor active range of motion, exhalation during exertion to decrease pelvic floor strain during lifting, vaginal moisturizers, knack technique, urge drill  Person educated: Patient Education method: Explanation, Demonstration, Tactile cues,  Verbal cues, and Handouts Education comprehension: verbalized understanding, returned demonstration, verbal cues required, and tactile cues required  HOME EXERCISE PROGRAM: Access Code: [email or web address] URL: [email or web address] Date: 02/04/2024 Prepared by: Robbin Chill  Exercises - Seated Quick Flick Pelvic Floor Contractions  - 1 x daily - 7 x weekly - 3 sets - 10 reps - Seated Exhale with Pelvic Floor Contraction and Hand to Mouth   - 1 x daily - 7 x weekly - 3 sets - 10 reps  ASSESSMENT:  CLINICAL IMPRESSION: Patient is a 43 y.o. female who was seen today for physical therapy treatment for urge urinary incontinence/fecal incontinence. Patient is in high pain in the pelvis today but she has had no instances of fecal incontinence within the past week. Her pain levels are most likely stress related - she has been transitioning jobs. Internal rectal treatment provided today to decrease muscle tension in the rectum. Patient had significant muscle tension present that decreased with rectal soft tissue mobilization and deep diaphragmatic breathing. Patient needs consistent rectal muscle release work on a regular basis to ensure that her puborectalis muscle doesn't stay in a state of spasm, so we discussed the routine use of a pelvic wand and pt plans to start using her wand at home. Patient's bilateral puborectalis musculature was highly sensitive today but responded well to sustained pressure release techniques. 0/10 pain at end of session. Pt would benefit from additional PT to further address deficits.      OBJECTIVE IMPAIRMENTS: decreased coordination, decreased endurance, decreased ROM, and decreased strength.   ACTIVITY LIMITATIONS: continence  PARTICIPATION LIMITATIONS: none  PERSONAL FACTORS: Fitness and 1 comorbidity: Chrons disease are also affecting patient's functional outcome.   REHAB POTENTIAL: Good  CLINICAL DECISION MAKING: Stable/uncomplicated  EVALUATION  COMPLEXITY: Low   GOALS: Goals reviewed with patient? Yes  SHORT TERM GOALS: Target date: 01/21/2024  Pt will be independent with HEP.  Baseline: Goal status: GOAL MET 01/28/24  2.  Pt will be able to teach back and utilize urge suppression technique in order to help reduce number of trips to the bathroom.   Baseline:  Goal status: GOAL MET 01/28/24  3.  Pt will be independent with diaphragmatic breathing and down training activities in order to improve pelvic floor relaxation and range of motion. Baseline:  Goal status: GOAL MET 01/28/24  LONG TERM GOALS: Target date: 06/22/2024  Pt will be independent with advanced HEP.  Baseline:  Goal status: GOAL MET 02/14/24  2.  Pt will report decreased urinary frequency to 8-10 voids per day to improve general bladder habits and quality of life.  Baseline:  Goal status: GOAL MET 02/14/24  3.  Pt to demonstrate at least 4/5 pelvic floor strength for improved pelvic stability and decreased strain at pelvic floor/ decrease leakage.  Baseline: 5/5 Goal  status: GOAL MET 04/07/24  4. Pt to demonstrate improved coordination of pelvic floor and breathing mechanics with 10# squat with appropriate synergistic patterns to decrease pain and leakage at least 75% of the time.   Baseline:  Goal status: GOAL MET 04/07/24  5. Patient will report independent use of pelvic floor wand to release rectal tension following bowel movements to decrease rectal pain at rest and following bowel movements to improve quality of life.  Goal status: ONGOING 04/07/24  PLAN:  PT FREQUENCY: 1x/week  PT DURATION: 8 weeks  PLANNED INTERVENTIONS: 97110-Therapeutic exercises, 97530- Therapeutic activity, 97112- Neuromuscular re-education, 97535- Self Care, [id number]- Manual therapy, [phone]- Aquatic Therapy, [phone]- Ultrasound, Patient/Family education, Taping, Dry Needling, Joint mobilization, Spinal mobilization, Scar mobilization, Cryotherapy, and Moist heat  PLAN FOR NEXT  SESSION: CONTINUED INTERNAL TREATMENT AND WAND EDUCATION FOR INDEPENDENT USE  Robbin Chill, PT, DPT 04/28/24 4:29 PM

## 2024-05-05 ENCOUNTER — Ambulatory Visit: Admitting: Physical Therapy

## 2024-05-05 ENCOUNTER — Encounter: Payer: Self-pay | Admitting: Physical Therapy

## 2024-05-12 ENCOUNTER — Ambulatory Visit: Admitting: Physical Therapy

## 2024-05-19 ENCOUNTER — Encounter: Admitting: Physical Therapy

## 2024-06-02 ENCOUNTER — Ambulatory Visit: Payer: Self-pay | Admitting: Physical Therapy

## 2024-06-03 DIAGNOSIS — K219 Gastro-esophageal reflux disease without esophagitis: Secondary | ICD-10-CM | POA: Diagnosis not present

## 2024-06-03 DIAGNOSIS — K50011 Crohn's disease of small intestine with rectal bleeding: Secondary | ICD-10-CM | POA: Diagnosis not present

## 2024-07-06 DIAGNOSIS — K50011 Crohn's disease of small intestine with rectal bleeding: Secondary | ICD-10-CM | POA: Diagnosis not present

## 2024-08-31 DIAGNOSIS — K50011 Crohn's disease of small intestine with rectal bleeding: Secondary | ICD-10-CM | POA: Diagnosis not present

## 2024-09-01 ENCOUNTER — Other Ambulatory Visit: Payer: Self-pay | Admitting: Family Medicine

## 2024-09-01 ENCOUNTER — Ambulatory Visit
Admission: RE | Admit: 2024-09-01 | Discharge: 2024-09-01 | Disposition: A | Discharge status: Home / Self Care | Source: Ambulatory Visit | Attending: Family Medicine | Admitting: Family Medicine

## 2024-09-01 ENCOUNTER — Ambulatory Visit
Admission: RE | Admit: 2024-09-01 | Discharge: 2024-09-01 | Disposition: A | Payer: Self-pay | Source: Ambulatory Visit | Attending: Family Medicine | Admitting: Family Medicine

## 2024-09-01 DIAGNOSIS — C8590 Non-Hodgkin lymphoma, unspecified, unspecified site: Secondary | ICD-10-CM | POA: Diagnosis not present

## 2024-09-01 DIAGNOSIS — R599 Enlarged lymph nodes, unspecified: Secondary | ICD-10-CM

## 2024-09-01 DIAGNOSIS — R221 Localized swelling, mass and lump, neck: Secondary | ICD-10-CM

## 2024-09-02 ENCOUNTER — Other Ambulatory Visit: Payer: Self-pay | Admitting: Family Medicine

## 2024-09-02 DIAGNOSIS — R221 Localized swelling, mass and lump, neck: Secondary | ICD-10-CM

## 2024-09-07 ENCOUNTER — Ambulatory Visit
Admission: RE | Admit: 2024-09-07 | Discharge: 2024-09-07 | Disposition: A | Source: Ambulatory Visit | Attending: Family Medicine | Admitting: Family Medicine

## 2024-09-07 DIAGNOSIS — R221 Localized swelling, mass and lump, neck: Secondary | ICD-10-CM | POA: Diagnosis not present

## 2024-09-07 MED ORDER — IOPAMIDOL (ISOVUE-300) INJECTION 61%
75.0000 mL | Freq: Once | INTRAVENOUS | Status: AC | PRN
  Administered 2024-09-07: 75 mL via INTRAVENOUS

## 2024-09-14 DIAGNOSIS — Z1231 Encounter for screening mammogram for malignant neoplasm of breast: Secondary | ICD-10-CM | POA: Diagnosis not present

## 2024-09-15 DIAGNOSIS — K5000 Crohn's disease of small intestine without complications: Secondary | ICD-10-CM | POA: Diagnosis not present

## 2024-09-15 DIAGNOSIS — F900 Attention-deficit hyperactivity disorder, predominantly inattentive type: Secondary | ICD-10-CM | POA: Diagnosis not present

## 2024-09-15 DIAGNOSIS — E46 Unspecified protein-calorie malnutrition: Secondary | ICD-10-CM | POA: Diagnosis not present

## 2024-09-25 DIAGNOSIS — Z713 Dietary counseling and surveillance: Secondary | ICD-10-CM | POA: Diagnosis not present

## 2024-10-19 DIAGNOSIS — D509 Iron deficiency anemia, unspecified: Secondary | ICD-10-CM | POA: Diagnosis not present

## 2024-10-19 DIAGNOSIS — E538 Deficiency of other specified B group vitamins: Secondary | ICD-10-CM | POA: Diagnosis not present

## 2024-10-19 DIAGNOSIS — D500 Iron deficiency anemia secondary to blood loss (chronic): Secondary | ICD-10-CM | POA: Diagnosis not present

## 2024-10-26 DIAGNOSIS — K50011 Crohn's disease of small intestine with rectal bleeding: Secondary | ICD-10-CM | POA: Diagnosis not present
